# Patient Record
Sex: Female | Born: 1960
Health system: Southern US, Community
[De-identification: ages and names within clinical notes are randomized; demographics above are authoritative.]

## PROBLEM LIST (undated history)

## (undated) DIAGNOSIS — F32A Depression, unspecified: Secondary | ICD-10-CM

## (undated) HISTORY — DX: Depression, unspecified: F32.A

## (undated) HISTORY — PX: COLONOSCOPY: SHX174

---

## 2006-09-25 ENCOUNTER — Encounter: Payer: Self-pay | Admitting: Family Medicine

## 2006-09-25 LAB — CONVERTED CEMR LAB: Pap Smear: NORMAL

## 2006-09-27 ENCOUNTER — Ambulatory Visit: Payer: Self-pay | Admitting: Family Medicine

## 2006-09-28 ENCOUNTER — Other Ambulatory Visit: Admission: RE | Admit: 2006-09-28 | Discharge: 2006-09-28 | Payer: Self-pay | Admitting: Family Medicine

## 2006-09-28 ENCOUNTER — Ambulatory Visit: Payer: Self-pay | Admitting: Family Medicine

## 2006-11-09 ENCOUNTER — Ambulatory Visit: Payer: Self-pay | Admitting: Family Medicine

## 2007-05-18 ENCOUNTER — Ambulatory Visit: Payer: Self-pay | Admitting: Family Medicine

## 2007-05-18 ENCOUNTER — Encounter (INDEPENDENT_AMBULATORY_CARE_PROVIDER_SITE_OTHER): Payer: Self-pay | Admitting: Internal Medicine

## 2007-05-18 DIAGNOSIS — N760 Acute vaginitis: Secondary | ICD-10-CM | POA: Insufficient documentation

## 2007-05-18 LAB — CONVERTED CEMR LAB: KOH Prep: NEGATIVE

## 2007-05-19 LAB — CONVERTED CEMR LAB
Chlamydia, DNA Probe: NEGATIVE
GC Probe Amp, Genital: NEGATIVE

## 2007-07-26 ENCOUNTER — Telehealth: Payer: Self-pay | Admitting: Family Medicine

## 2007-08-01 ENCOUNTER — Encounter: Payer: Self-pay | Admitting: Family Medicine

## 2007-08-01 DIAGNOSIS — F329 Major depressive disorder, single episode, unspecified: Secondary | ICD-10-CM

## 2007-08-01 DIAGNOSIS — D259 Leiomyoma of uterus, unspecified: Secondary | ICD-10-CM

## 2007-08-01 DIAGNOSIS — D509 Iron deficiency anemia, unspecified: Secondary | ICD-10-CM

## 2007-08-02 ENCOUNTER — Ambulatory Visit: Payer: Self-pay | Admitting: Family Medicine

## 2007-08-02 DIAGNOSIS — R0989 Other specified symptoms and signs involving the circulatory and respiratory systems: Secondary | ICD-10-CM

## 2007-08-02 DIAGNOSIS — M533 Sacrococcygeal disorders, not elsewhere classified: Secondary | ICD-10-CM | POA: Insufficient documentation

## 2007-08-02 DIAGNOSIS — R0609 Other forms of dyspnea: Secondary | ICD-10-CM | POA: Insufficient documentation

## 2007-08-05 LAB — CONVERTED CEMR LAB
FSH: 9.1 milliintl units/mL
LH: 15.4 milliintl units/mL
TSH: 0.9 microintl units/mL (ref 0.35–5.50)

## 2007-08-11 ENCOUNTER — Ambulatory Visit: Payer: Self-pay | Admitting: Family Medicine

## 2007-08-16 LAB — CONVERTED CEMR LAB
Basophils Relative: 1 % (ref 0.0–1.0)
Eosinophils Relative: 1.4 % (ref 0.0–5.0)
HCT: 30.3 % — ABNORMAL LOW (ref 36.0–46.0)
Hemoglobin: 10 g/dL — ABNORMAL LOW (ref 12.0–15.0)
MCHC: 33 g/dL (ref 30.0–36.0)
MCV: 80.4 fL (ref 78.0–100.0)
Neutrophils Relative %: 60.1 % (ref 43.0–77.0)
Platelets: 201 10*3/uL (ref 150–400)
Transferrin: 341.8 mg/dL (ref >=212.0)
WBC: 4.6 10*3/uL (ref 4.5–10.5)

## 2007-08-23 ENCOUNTER — Ambulatory Visit: Payer: Self-pay | Admitting: Cardiology

## 2007-08-23 ENCOUNTER — Ambulatory Visit: Payer: Self-pay

## 2007-08-26 ENCOUNTER — Ambulatory Visit: Payer: Self-pay

## 2007-08-26 ENCOUNTER — Encounter: Payer: Self-pay | Admitting: Family Medicine

## 2007-09-16 ENCOUNTER — Ambulatory Visit: Payer: Self-pay | Admitting: Family Medicine

## 2007-09-19 ENCOUNTER — Encounter (INDEPENDENT_AMBULATORY_CARE_PROVIDER_SITE_OTHER): Payer: Self-pay | Admitting: *Deleted

## 2007-11-11 ENCOUNTER — Encounter (INDEPENDENT_AMBULATORY_CARE_PROVIDER_SITE_OTHER): Payer: Self-pay | Admitting: Internal Medicine

## 2007-11-11 ENCOUNTER — Ambulatory Visit: Payer: Self-pay | Admitting: Family Medicine

## 2007-11-16 ENCOUNTER — Telehealth (INDEPENDENT_AMBULATORY_CARE_PROVIDER_SITE_OTHER): Payer: Self-pay | Admitting: Internal Medicine

## 2007-11-16 LAB — CONVERTED CEMR LAB: Chlamydia, DNA Probe: NEGATIVE

## 2008-01-19 ENCOUNTER — Encounter (INDEPENDENT_AMBULATORY_CARE_PROVIDER_SITE_OTHER): Payer: Self-pay | Admitting: Internal Medicine

## 2008-01-19 ENCOUNTER — Other Ambulatory Visit: Admission: RE | Admit: 2008-01-19 | Discharge: 2008-01-19 | Payer: Self-pay | Admitting: Family Medicine

## 2008-01-19 ENCOUNTER — Ambulatory Visit: Payer: Self-pay | Admitting: Family Medicine

## 2008-01-30 LAB — CONVERTED CEMR LAB
ALT: 12 units/L (ref 0–35)
Alkaline Phosphatase: 60 units/L (ref 39–117)
BUN: 11 mg/dL (ref 6–23)
Bilirubin, Direct: 0.1 mg/dL (ref 0.0–0.3)
Calcium: 9 mg/dL (ref 8.4–10.5)
Chloride: 107 meq/L (ref 96–112)
Cholesterol: 174 mg/dL (ref 0–200)
Creatinine, Ser: 0.9 mg/dL (ref 0.4–1.2)
Eosinophils Absolute: 0.1 10*3/uL (ref 0.0–0.7)
Eosinophils Relative: 1 % (ref 0.0–5.0)
GFR calc Af Amer: 87 mL/min
GFR calc non Af Amer: 72 mL/min
HCT: 29 % — ABNORMAL LOW (ref 36.0–46.0)
Lymphocytes Relative: 24.9 % (ref 12.0–46.0)
MCHC: 31.4 g/dL (ref 30.0–36.0)
Monocytes Absolute: 0.1 10*3/uL (ref 0.1–1.0)
Monocytes Relative: 2.7 % — ABNORMAL LOW (ref 3.0–12.0)
TSH: 0.63 microintl units/mL (ref 0.35–5.50)
Total Bilirubin: 0.6 mg/dL (ref 0.3–1.2)
Total Protein: 7 g/dL (ref 6.0–8.3)
WBC: 5.1 10*3/uL (ref 4.5–10.5)

## 2008-01-31 ENCOUNTER — Encounter (INDEPENDENT_AMBULATORY_CARE_PROVIDER_SITE_OTHER): Payer: Self-pay | Admitting: *Deleted

## 2008-02-07 ENCOUNTER — Encounter (INDEPENDENT_AMBULATORY_CARE_PROVIDER_SITE_OTHER): Payer: Self-pay | Admitting: Internal Medicine

## 2008-02-07 ENCOUNTER — Encounter (INDEPENDENT_AMBULATORY_CARE_PROVIDER_SITE_OTHER): Payer: Self-pay | Admitting: *Deleted

## 2008-02-08 ENCOUNTER — Encounter (INDEPENDENT_AMBULATORY_CARE_PROVIDER_SITE_OTHER): Payer: Self-pay | Admitting: Internal Medicine

## 2008-02-09 ENCOUNTER — Ambulatory Visit: Payer: Self-pay | Admitting: Family Medicine

## 2008-02-09 ENCOUNTER — Encounter (INDEPENDENT_AMBULATORY_CARE_PROVIDER_SITE_OTHER): Payer: Self-pay | Admitting: Internal Medicine

## 2008-02-16 ENCOUNTER — Telehealth (INDEPENDENT_AMBULATORY_CARE_PROVIDER_SITE_OTHER): Payer: Self-pay | Admitting: Internal Medicine

## 2008-02-17 ENCOUNTER — Encounter (INDEPENDENT_AMBULATORY_CARE_PROVIDER_SITE_OTHER): Payer: Self-pay | Admitting: *Deleted

## 2008-03-30 ENCOUNTER — Ambulatory Visit: Payer: Self-pay | Admitting: Family Medicine

## 2008-03-30 DIAGNOSIS — J020 Streptococcal pharyngitis: Secondary | ICD-10-CM | POA: Insufficient documentation

## 2008-03-30 DIAGNOSIS — L259 Unspecified contact dermatitis, unspecified cause: Secondary | ICD-10-CM

## 2008-09-12 ENCOUNTER — Inpatient Hospital Stay (HOSPITAL_COMMUNITY): Admission: AD | Admit: 2008-09-12 | Discharge: 2008-09-12 | Payer: Self-pay | Admitting: Obstetrics and Gynecology

## 2008-10-03 ENCOUNTER — Other Ambulatory Visit: Admission: RE | Admit: 2008-10-03 | Discharge: 2008-10-03 | Payer: Self-pay | Admitting: Obstetrics & Gynecology

## 2008-10-03 ENCOUNTER — Ambulatory Visit: Payer: Self-pay | Admitting: Obstetrics & Gynecology

## 2008-10-26 DIAGNOSIS — Z9889 Other specified postprocedural states: Secondary | ICD-10-CM | POA: Insufficient documentation

## 2008-10-26 HISTORY — DX: Other specified postprocedural states: Z98.890

## 2009-10-01 ENCOUNTER — Ambulatory Visit: Payer: Self-pay | Admitting: Oncology

## 2009-10-02 ENCOUNTER — Encounter (HOSPITAL_COMMUNITY): Admission: RE | Admit: 2009-10-02 | Discharge: 2009-10-25 | Payer: Self-pay | Admitting: Endocrinology

## 2009-10-21 LAB — CBC & DIFF AND RETIC
EOS%: 0.6 % (ref 0.0–7.0)
Eosinophils Absolute: 0 10*3/uL (ref 0.0–0.5)
HCT: 28.8 % — ABNORMAL LOW (ref 34.8–46.6)
MCHC: 31.3 g/dL — ABNORMAL LOW (ref 31.5–36.0)
MONO%: 4.5 % (ref 0.0–14.0)
NEUT#: 3.2 10*3/uL (ref 1.5–6.5)
NEUT%: 64.8 % (ref 38.4–76.8)
Platelets: 225 10*3/uL (ref 145–400)
RBC: 3.95 10*6/uL (ref 3.70–5.45)
Retic %: 1.09 % (ref 0.50–1.50)
Retic Ct Abs: 43.06 10*3/uL (ref 18.30–72.70)
WBC: 4.9 10*3/uL (ref 3.9–10.3)
lymph#: 1.4 10*3/uL (ref 0.9–3.3)

## 2009-10-21 LAB — IRON AND TIBC
%SAT: 7 % — ABNORMAL LOW (ref 20–55)
TIBC: 396 ug/dL (ref 250–470)

## 2009-10-21 LAB — COMPREHENSIVE METABOLIC PANEL
Albumin: 3.6 g/dL (ref 3.5–5.2)
Alkaline Phosphatase: 63 U/L (ref 39–117)
BUN: 10 mg/dL (ref 6–23)
CO2: 28 mEq/L (ref 19–32)
Chloride: 102 mEq/L (ref 96–112)
Potassium: 3.8 mEq/L (ref 3.5–5.3)
Total Bilirubin: 0.4 mg/dL (ref 0.3–1.2)

## 2009-10-21 LAB — MORPHOLOGY: PLT EST: ADEQUATE

## 2009-10-21 LAB — FERRITIN: Ferritin: 6 ng/mL — ABNORMAL LOW (ref 10–291)

## 2009-10-21 LAB — CHCC SMEAR

## 2009-12-16 ENCOUNTER — Ambulatory Visit: Payer: Self-pay | Admitting: Oncology

## 2010-01-15 ENCOUNTER — Ambulatory Visit: Payer: Self-pay | Admitting: Oncology

## 2010-02-14 ENCOUNTER — Ambulatory Visit: Payer: Self-pay | Admitting: Oncology

## 2010-02-14 LAB — CBC WITH DIFFERENTIAL/PLATELET
Basophils Absolute: 0 10*3/uL (ref 0.0–0.1)
EOS%: 0.9 % (ref 0.0–7.0)
HGB: 10.4 g/dL — ABNORMAL LOW (ref 11.6–15.9)
LYMPH%: 29.6 % (ref 14.0–49.7)
MCH: 28.5 pg (ref 25.1–34.0)
MCV: 84.3 fL (ref 79.5–101.0)
NEUT#: 2.7 10*3/uL (ref 1.5–6.5)
NEUT%: 61.9 % (ref 38.4–76.8)
RBC: 3.65 10*6/uL — ABNORMAL LOW (ref 3.70–5.45)
WBC: 4.4 10*3/uL (ref 3.9–10.3)

## 2010-02-14 LAB — IRON AND TIBC
%SAT: 10 % — ABNORMAL LOW (ref 20–55)
Iron: 43 ug/dL (ref 42–145)
TIBC: 422 ug/dL (ref 250–470)

## 2010-04-21 IMAGING — US US TRANSVAGINAL NON-OB
1 series · 14 of 25 positions shown · non-contrast
Comparison: None

CLINICAL DATA: Left-sided pelvic pain.  Abnormal uterine bleeding.
Pelvic mass.  Enlarged uterus.

TRANSABDOMINAL AND TRANSVAGINAL ULTRASOUND OF PELVIS
TECHNIQUE: Both transabdominal and transvaginal ultrasound
examinations of the pelvis were performed including evaluation of
the uterus, ovaries, adnexal regions, and pelvic cul-de-sac.

[Series 1: us transvaginal non-ob · 0.28mm/px · 70 acquisitions, 14 frames shown]
[im 1/70]
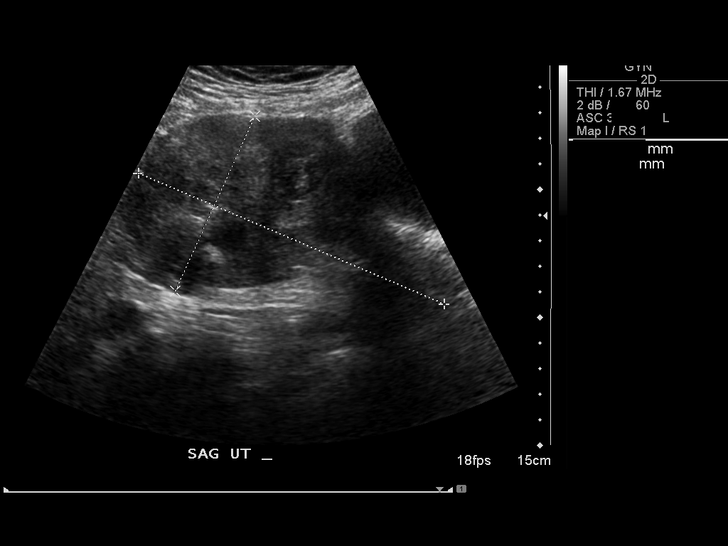
[im 6/70]
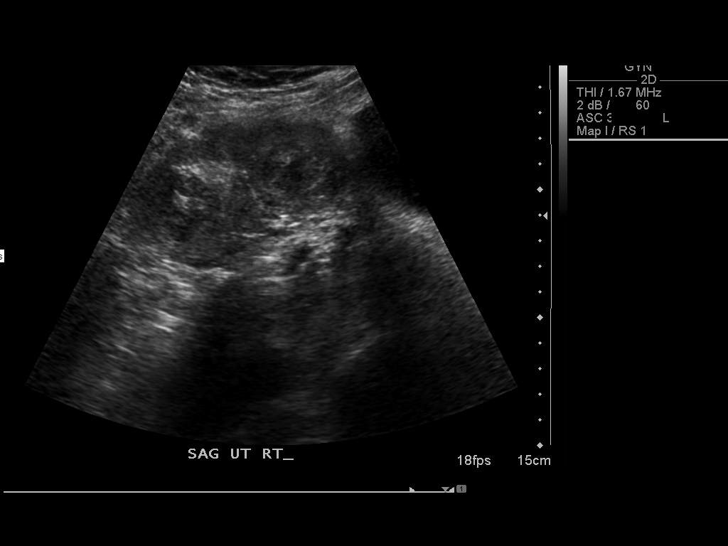
[im 12/70]
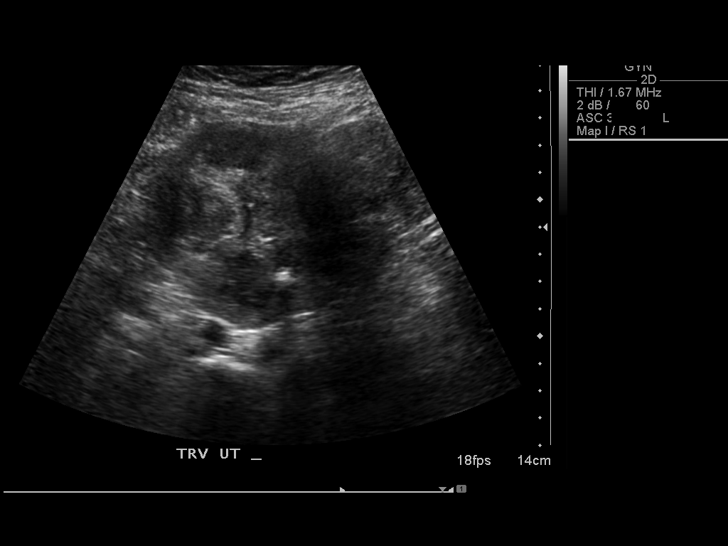
[im 18/70]
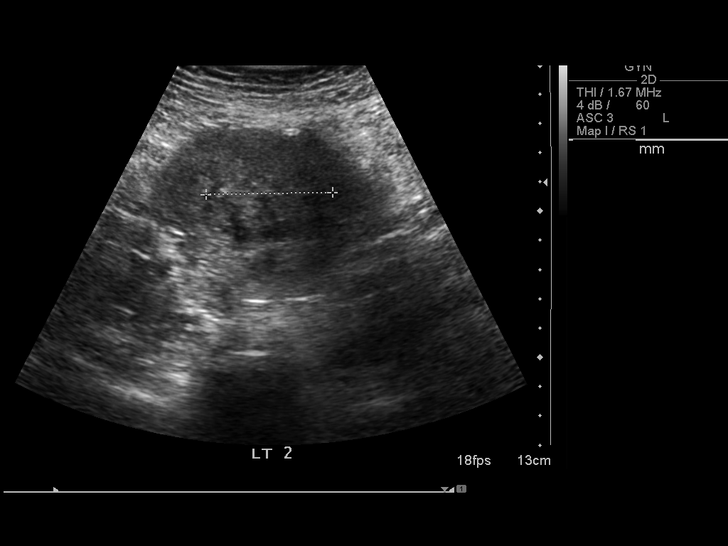
[im 24/70]
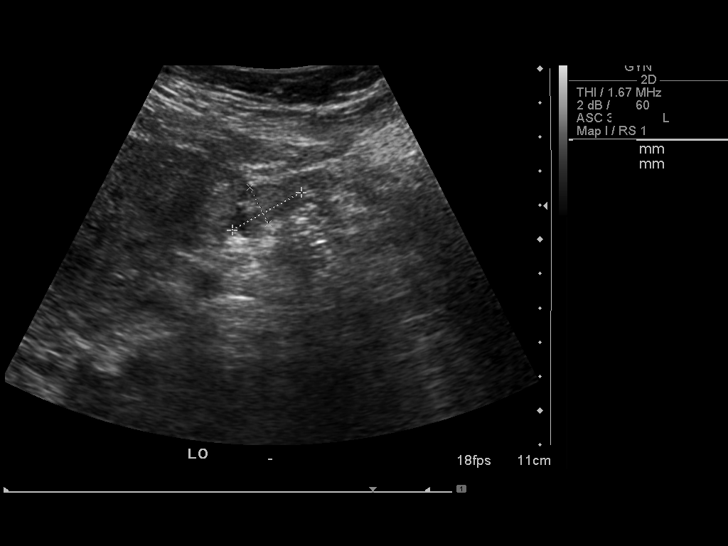
[im 26/70]
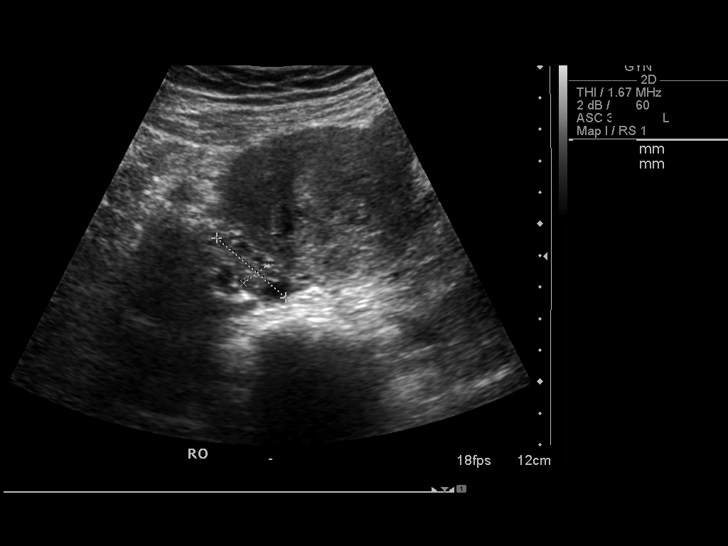
[im 32/70]
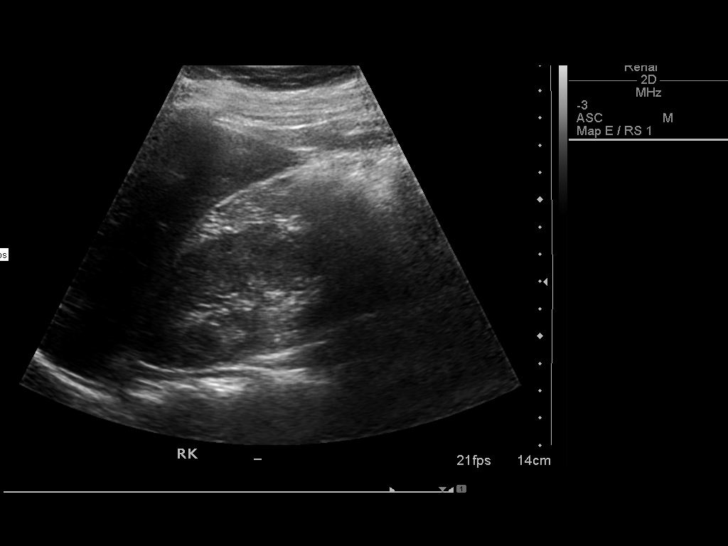
[im 38/70]
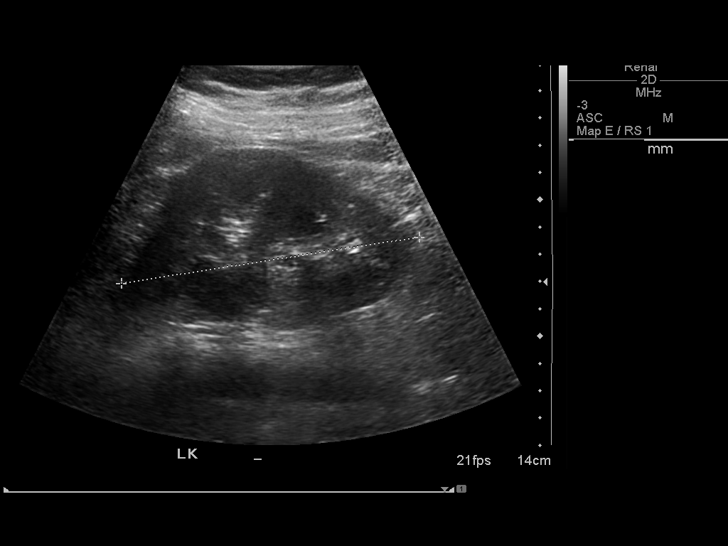
[im 44/70]
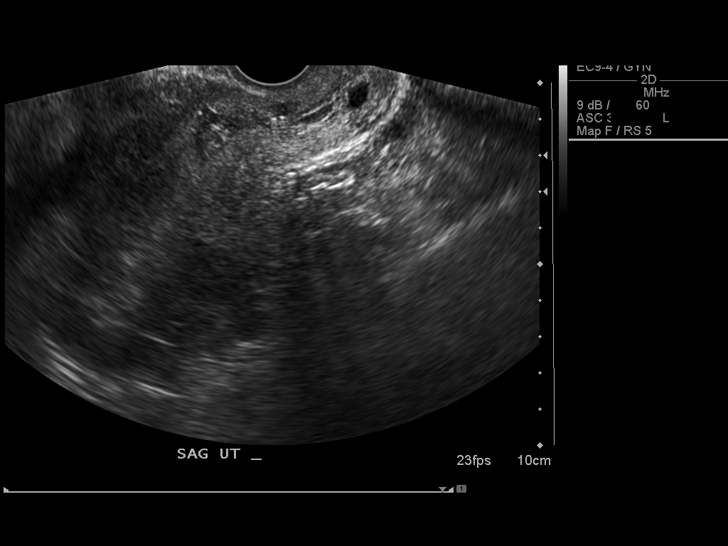
[im 47/70]
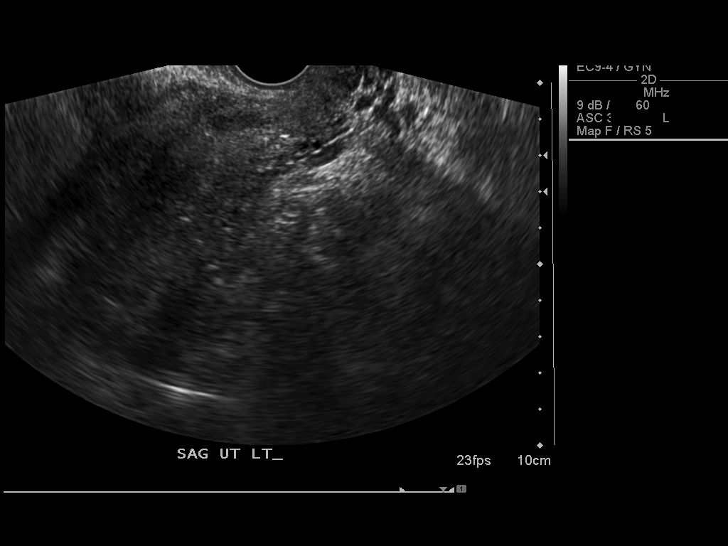
[im 52/70]
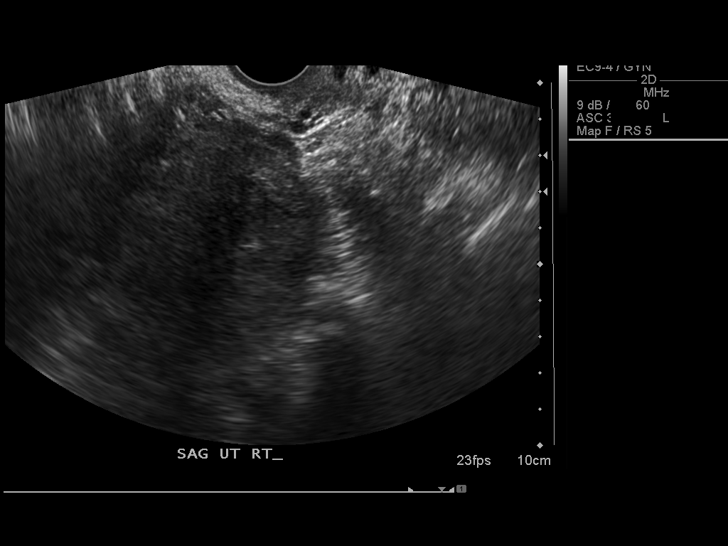
[im 58/70]
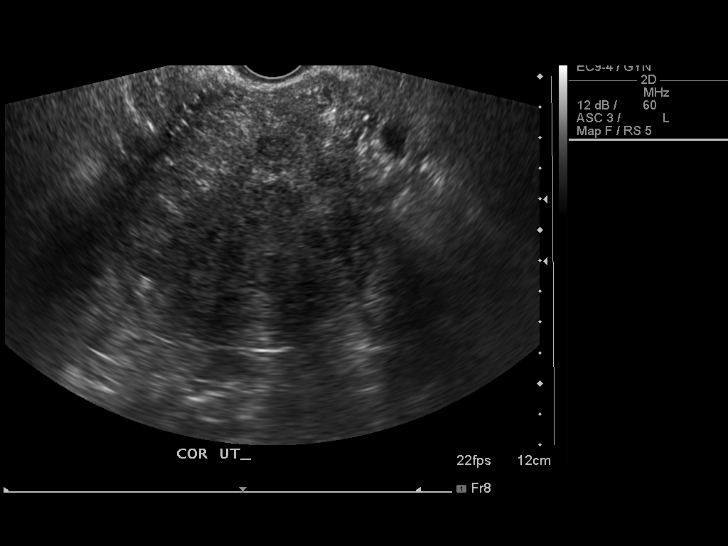
[im 64/70]
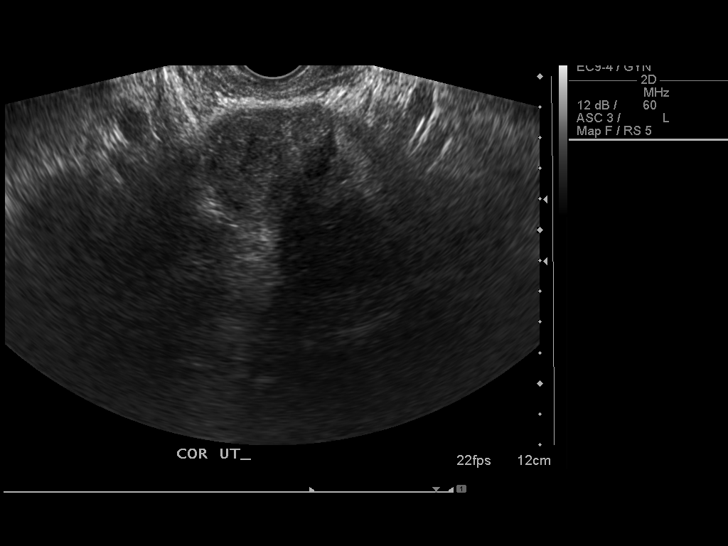
[im 70/70]
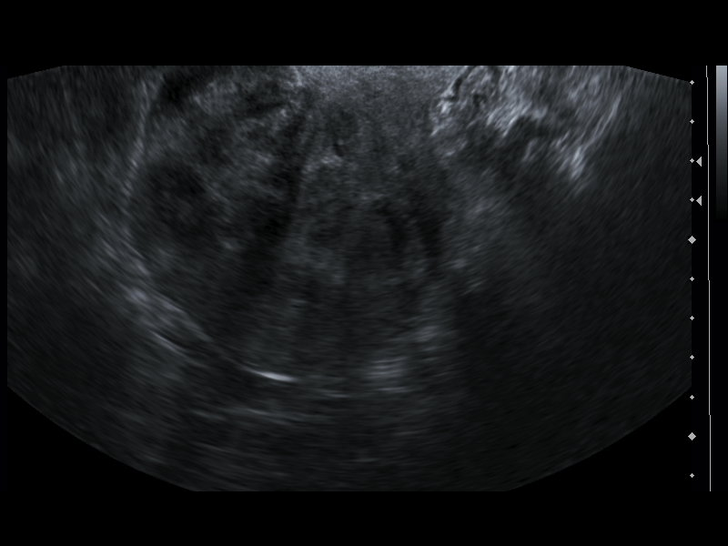

[14 of 25 positions shown; findings below may reference images not displayed]

FINDINGS: The uterus is enlarged, measuring approximately 13 by 8 x
9 cm.  Multiple fibroids are seen involving the uterus diffusely.
Several of these fibroids show calcifications.  The largest of
these fibroids measures 4.3 cm in diameter.  The endometrium is not
well visualized due to acoustic shadowing from these fibroids.

Both ovaries are normal in appearance.  No adnexal masses are
identified and there is no evidence of free fluid.  Limited images
of the kidneys show no evidence of hydronephrosis.
IMPRESSION: 1.  Moderately enlarged uterus with numerous fibroids, largest
measuring 4.3 cm.
2.  Normal ovaries.

## 2010-10-01 ENCOUNTER — Ambulatory Visit: Payer: Self-pay | Admitting: Oncology

## 2011-03-10 NOTE — Procedures (Signed)
[facility]                              EXERCISE TREADMILL   NAME:[name], Susan                         MRN:          [id number]  DATE:08/23/2007                            DOB:          [birth year]    REFERRING PHYSICIAN:  Kerby Nora, MD   [name] is referred today by Dr. Kerby Nora of [facility]  for dyspnea on exertion and concern about coronary disease.   She has no significant risk factors.   Her baseline blood pressure today is 122/74, her pulse was 81 and  regular.  Her baseline ECG is normal.   She exercised on a Bruce protocol.  She developed chest tightness and  dizziness.  Her blood pressure increased to 130/63.  She exercised for a  total of 6 minutes and 55 seconds, reaching a peak heart rate of 169  beats per minute which is 96% of predicted maximum heart rate.   Three minutes and 50 second into her stress test, she had ST segment  horizontal flattening in II, III and aVF and V5 and 6.  This started to  go flat to downward at 4 minutes and 50 seconds and at 5 minutes and 50  seconds.   The EKG changes started to resolve significantly at less than a minute.  At a minute and 50 seconds, they were totally resolved.   CONCLUSION:  1. Equivocal exercise stress study.  2. Dyspnea on exertion and chest tightness with EKG changes.  3. No significant cardiac risk factors.   I had a long talk with [name].  I have recommended an exercise rest-  stress Myoview.  We will make arrangements for this in the near future.     Thomas C. Daleen Squibb, MD, [facility]  Electronically Signed    TCW/MedQ  DD: 08/23/2007  DT: 08/24/2007  Job #: [id number]   cc:   Kerby Nora, MD

## 2011-03-10 NOTE — Group Therapy Note (Signed)
Susan [name], Susan [name] NO.:  [id number]   MEDICAL RECORD NO.:  [id number]          PATIENT TYPE:  WOC   LOCATION:  [facility]                   FACILITY:  [facility]   PHYSICIAN:  Dorthula Perfect, MD     DATE OF BIRTH:  [birth year]   DATE OF SERVICE:  10/03/2008                                  CLINIC NOTE   A 49 year old black female multigravida who comes in today referred from  [facility]U with dysfunctional uterine bleeding and fibroids.  Prior to October  of this year she had a normal menstrual history with periods lasting 3  days without any significant pain or abnormal flow.  For birth control,  she elected to use Depo-Provera, which she received for the first time  in October.  Ever since that time she has had abnormal daily vaginal  bleeding, often using more than 4 pads per day.  She sometimes stains  her underwear.  She does have significantly increased pain and cramping  with it.  When she was seen in the MAU, she had a  transabdominal/transvaginal ultrasound, which revealed an enlarged  uterus measuring 3 x 8 x 9 cm containing multiple fibroids diffusely  located.  Several contained calcifications.  The largest fibroid  measured 4.3 cm in diameter.  The endometrium was not well visualized.  The ovaries were normal.   Of interest to note, was back in about 2002 in [place], she had  uterine embolization done for fibroids.  She does not recall having had  an ultrasound since that time.   PHYSICAL EXAM:  Height 5 feet 3 inches, weight 158, blood pressure  115/67.  Examination of the abdomen reveals the uterine fundus being palpable up  to about 2 fingerbreadths below the umbilicus.  It is nodular and the  upper part of the uterus is somewhat tender.  The uterus is about the  height of a 16-week pregnancy without the lateral enlargement.  Bimanual  exam:  External genitalia, BUS glands are normal, vaginal vault, as was  the cervix, which is located somewhat  anteriorly in the vagina.  Bimanual examination confirms the enlarged uterus measuring about 20 cm  in height.  It is quite nodular.  The ovaries are not palpable on either  side because of the size of the uterus.   The cervix was grasped with a single-tooth tenaculum.  The uterus  sounded anteriorly greater than 15 cm.  Endometrial biopsy was done  using 2 devices.  Each of these sounded the uterus about 20 cm.   IMPRESSION:  1. Numerous uterine fibroids.  2. Abnormal uterine bleeding.  Whether or not the abnormal uterine      bleeding is secondary to Depo-Provera or the fibroids is kind of      difficult to ascertain.  Of note, as mentioned above, she had a      normal menstrual history prior to October, when she received the      Depo-Provera.   PLAN:  1. She will return in 2 weeks for results.  2. I have asked her to not repeat the Depo-Provera.  If she  wants to      wait 4, 6 or 8 weeks to see if her periods become normal, I think      that is reasonable.  But then again, she may elect to end up having      a hysterectomy.           ______________________________  Dorthula Perfect, MD     ER/MEDQ  D:  10/03/2008  T:  10/03/2008  Job:  [id number]

## 2011-03-13 NOTE — Assessment & Plan Note (Signed)
[facility]                           [facility] OFFICE NOTE   NAME:[name], [name]                         MRN:          [id number]  DATE:09/27/2006                            DOB:          06-07-1961    CHIEF COMPLAINT:  A 50 year old black female here to establish new  doctor.   HISTORY OF PRESENT ILLNESS:  Mrs. Susan [name] moved here from [facility]  secondary to job many months ago.  She has the following issues:  1. Iron deficiency anemia, chronic:  She denies any current fatigue,      shortness of breath, dizziness.  She is unsure as to why she has      chronic anemia, but states the previous doctor thought it was      because of her uterine fibroids.  She is status post uterine artery      embolization.  She denies any abdominal cramping or heavy vaginal      bleeding.  She does feel like she may be low in iron because she      has not been taking iron regularly.  2. Depression, well controlled:  On Cymbalta without side effects      mixed without homicidal ideations.  She feels this is secondary to      decreased stress in her life.  She feels much better after moving      and from separating from her husband one year ago.  3. STD exposure:  She is concerned that she may have a sexually      transmitted disease because her husband had a girlfriend before      they stopped being sexually active.  She has no specific disease      she is concerned about, but would like to be tested for all of      them.   PAST MEDICAL HISTORY:  1. Anemia, iron deficiency.  2. Uterine fibroids.  3. Depression.   HOSPITALIZATIONS, SURGERIES AND PROCEDURES:  1. In 2003, uterine artery embolization.  2. In 1987 and 1992, C-section.  3. Mammogram negative in 2005.  4. Pap smear abnormal in 2004, not followed up on.   FAMILY HISTORY:  Father deceased at age 15 with stroke and heart attack.  Mother alive at age 36 with hypertension and chronic renal  insufficiency.  She has three brothers, one of whom died with HIV, one  who died with liver disease and one who is alive with asbestosis.  She  also has three sisters who are healthy.  She has no family history of MI  less than age 52.  She has an aunt with diabetes.  Her mother got breast  cancer at age 55, but there is no other cancer that runs in the family.   SOCIAL HISTORY:  Denies smoking, alcohol and drug abuse.  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  She has been separated for one year.  She is not  currently sexually active.  She has two children who are healthy.  She  does not get regular exercise.  She does  not have a healthy diet and  gets limited fruits and vegetables.  She drinks some water.   PHYSICAL EXAMINATION:  VITAL SIGNS:  Height 63-1/2 inches, weight 165,  making BMI 29, blood pressure 104/60, pulse 80, temperature 98.4.  GENERAL:  Healthy appearing female in no apparent distress.  HEENT:  PERRLA.  Extraocular muscles are intact.  Tympanic membranes  clear.  Nares clear.  Oropharynx clear.  No thyromegaly.  No  lymphadenopathy.  Normal conjunctivae.  No paleness.  CARDIOVASCULAR:  Regular rate and rhythm.  No murmurs, rubs or gallops.  Normal PMI.  Peripheral pulses 2+.  LUNGS:  Clear to auscultation bilaterally.  No wheezes, rales or  rhonchi.  ABDOMEN:  Soft, nontender, normoactive bowel sounds.  No  hepatosplenomegaly.  MUSCULOSKELETAL:  Strength 5/5 in upper and lower extremities.  NEUROLOGICAL:  Cranial nerves II-XII grossly intact.  Reflexes 2+.   ASSESSMENT/PLAN:  1. Iron deficiency anemia.  Will check hemoglobin and iron panel.  She      is asymptomatic at this point in time.  2. Depression, well controlled.  Will continue on Cymbalta.  No      refills needed at this point in time.  3. STD screening.  We will check an HIV and a syphilis.  She will      return for gonorrhea and chlamydia along with a Pap smear.  4. Prevention.  She will be scheduled for a  mammogram.  She is due for      a Pap smear and will return for this.  We can also check for      trichomonas with a wet-prep at that point in time.  She refused      influenza vaccine today.  She will consider a tetanus.  She is      unsure when her cholesterol panel was last checked, but states it      was normal at that last time.  I will obtain records from her      previous doctor.     Kerby Nora, MD  Electronically Signed    AB/MedQ  DD: 09/27/2006  DT: 09/28/2006  Job #: [id number]

## 2011-05-12 IMAGING — NM NM THYROID UPTAKE SINGLE (24 HR)
1 series · 1 of 1 positions shown · non-contrast
Comparison: None

CLINICAL DATA: Hyperthyroidism.

NUCLEAR MEDICINE 24 HOUR THYROID UPTAKE
TECHNIQUE: Following  per oral administered of S-1P1, thyroid
radioiodine uptake was calculated at  24 hours.
Radiopharmaceutical: 10.9 microcuries of S-1P1

[Series 1: st static · 2.35mm/px · 1 of 1 slices shown]
[im 1/1]
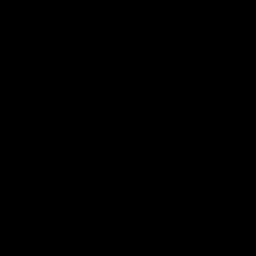

[1 of 1 positions shown; findings below may reference images not displayed]

FINDINGS: The 24 hours S-1P1 uptake was 0.2%.  Because of this no
scan could be performed.  This finding could be secondary to acute
thyroiditis.
IMPRESSION: The 24 hour S-1P1 uptake was 0.2%.  Normal is 10 35%. Findings
could be due to acute thyroiditis
Thyroid scan could not be performed because of the low uptake.

## 2011-07-15 ENCOUNTER — Inpatient Hospital Stay (INDEPENDENT_AMBULATORY_CARE_PROVIDER_SITE_OTHER)
Admission: RE | Admit: 2011-07-15 | Discharge: 2011-07-15 | Disposition: A | Discharge status: Home / Self Care | Payer: BC Managed Care – PPO | Source: Ambulatory Visit | Attending: Emergency Medicine | Admitting: Emergency Medicine

## 2011-07-15 DIAGNOSIS — D649 Anemia, unspecified: Secondary | ICD-10-CM

## 2011-07-15 DIAGNOSIS — G44209 Tension-type headache, unspecified, not intractable: Secondary | ICD-10-CM

## 2011-07-15 DIAGNOSIS — F3289 Other specified depressive episodes: Secondary | ICD-10-CM

## 2011-07-15 DIAGNOSIS — G4700 Insomnia, unspecified: Secondary | ICD-10-CM

## 2011-07-15 DIAGNOSIS — F329 Major depressive disorder, single episode, unspecified: Secondary | ICD-10-CM

## 2011-07-15 LAB — POCT I-STAT, CHEM 8
Creatinine, Ser: 0.8 mg/dL (ref 0.50–1.10)
Glucose, Bld: 88 mg/dL (ref 70–99)
HCT: 28 % — ABNORMAL LOW (ref 36.0–46.0)
Hemoglobin: 9.5 g/dL — ABNORMAL LOW (ref 12.0–15.0)
Potassium: 4.2 mEq/L (ref 3.5–5.1)
TCO2: 24 mmol/L (ref 0–100)

## 2011-07-29 LAB — CBC
MCV: 84.8
Platelets: 205
RBC: 3.91
WBC: 9.2

## 2011-07-29 LAB — WET PREP, GENITAL
Trich, Wet Prep: NONE SEEN
Yeast Wet Prep HPF POC: NONE SEEN

## 2011-07-29 LAB — URINALYSIS, ROUTINE W REFLEX MICROSCOPIC
Glucose, UA: NEGATIVE
Leukocytes, UA: NEGATIVE
Nitrite: NEGATIVE
Protein, ur: NEGATIVE
Urobilinogen, UA: 0.2
pH: 5.5

## 2011-07-29 LAB — POCT PREGNANCY, URINE: Preg Test, Ur: NEGATIVE

## 2011-07-29 LAB — URINE MICROSCOPIC-ADD ON

## 2011-07-31 LAB — POCT PREGNANCY, URINE: Preg Test, Ur: NEGATIVE

## 2020-01-02 DIAGNOSIS — R600 Localized edema: Secondary | ICD-10-CM | POA: Insufficient documentation

## 2020-01-02 DIAGNOSIS — R42 Dizziness and giddiness: Secondary | ICD-10-CM | POA: Insufficient documentation

## 2020-01-02 DIAGNOSIS — R0602 Shortness of breath: Secondary | ICD-10-CM | POA: Insufficient documentation

## 2021-03-17 DIAGNOSIS — Z01419 Encounter for gynecological examination (general) (routine) without abnormal findings: Secondary | ICD-10-CM | POA: Diagnosis not present

## 2021-03-17 DIAGNOSIS — Z1159 Encounter for screening for other viral diseases: Secondary | ICD-10-CM | POA: Diagnosis not present

## 2021-03-17 DIAGNOSIS — R3915 Urgency of urination: Secondary | ICD-10-CM | POA: Diagnosis not present

## 2021-03-17 DIAGNOSIS — Z113 Encounter for screening for infections with a predominantly sexual mode of transmission: Secondary | ICD-10-CM | POA: Diagnosis not present

## 2021-08-22 ENCOUNTER — Other Ambulatory Visit: Payer: Self-pay

## 2021-08-22 ENCOUNTER — Ambulatory Visit (INDEPENDENT_AMBULATORY_CARE_PROVIDER_SITE_OTHER): Payer: Self-pay | Admitting: Family

## 2021-08-22 ENCOUNTER — Encounter: Payer: Self-pay | Admitting: Family

## 2021-08-22 ENCOUNTER — Ambulatory Visit: Payer: Medicaid Other | Admitting: Family

## 2021-08-22 VITALS — BP 115/75 | HR 74 | Temp 98.3°F | Ht 63.5 in | Wt 207.6 lb

## 2021-08-22 DIAGNOSIS — F331 Major depressive disorder, recurrent, moderate: Secondary | ICD-10-CM | POA: Insufficient documentation

## 2021-08-22 DIAGNOSIS — Z0000 Encounter for general adult medical examination without abnormal findings: Secondary | ICD-10-CM

## 2021-08-22 DIAGNOSIS — R55 Syncope and collapse: Secondary | ICD-10-CM | POA: Insufficient documentation

## 2021-08-22 DIAGNOSIS — Z7689 Persons encountering health services in other specified circumstances: Secondary | ICD-10-CM | POA: Insufficient documentation

## 2021-08-22 MED ORDER — BUPROPION HCL ER (XL) 300 MG PO TB24: 300.0000 mg | ORAL_TABLET | Freq: Every day | ORAL | 0 refills | Status: DC

## 2021-08-22 NOTE — Progress Notes (Signed)
New Patient Office Visit  Subjective:  Patient ID: Susan [name], female    DOB: 07-23-1961  Age: 60 y.o. MRN: [id number]  CC:  Chief Complaint  Patient presents with   Establish Care   Depression   Loss of Consciousness    She complains of feelings of "blacking out' when laughing. Noticing 6 months ago. She denies Chest pain, or dizziness.    HPI Susan [name] presents to establish care. She also would like a physical and discuss one chronic and one acute problem today.   Syncope: Patient complains of near syncope. Onset was recently, with stable course since that time. Patient describes the episode as never actually lost consciousness, had precursor symptoms only, including feeling of almost losing consciousness. Patient also has associated symptoms of  laughing. The patient denies general feeling of lightheadedness. Taking culprit meds?: none   Depression, Follow-up:  She  was last seen for this 6 months ago by her previous PCP. Changes made at last visit include decreased med dose. She reports good compliance with treatment. She is not having side effects.  She reports good tolerance of treatment. Current symptoms include: depressed mood, fatigue, and feelings of worthlessness/guilt She feels she is Worse since last visit.  Depression screen PHQ 2/9 08/22/2021  Decreased Interest 3  Down, Depressed, Hopeless 3  PHQ - 2 Score 6  Altered sleeping 3  Tired, decreased energy 2  Change in appetite 3  Feeling bad or failure about yourself  3  Trouble concentrating 3  Moving slowly or fidgety/restless 2  Suicidal thoughts 0  PHQ-9 Score 22  Difficult doing work/chores Somewhat difficult    -----------------------------------------------------------------------------------------   Past Medical History:  Diagnosis Date   Depression     History reviewed. No pertinent surgical history.  Family History  Problem Relation Age of Onset   Hyperlipidemia Mother     Heart disease Mother    Stroke Father    Hypertension Father    Hypertension Sister    Cancer Brother    Alcohol abuse Brother     Social History   Socioeconomic History   Marital status: Divorced    Spouse name: Not on file   Number of children: Not on file   Years of education: Not on file   Highest education level: Not on file  Occupational History   Not on file  Tobacco Use   Smoking status: Never   Smokeless tobacco: Never  Substance and Sexual Activity   Alcohol use: Never   Drug use: Never   Sexual activity: Not on file  Other Topics Concern   Not on file  Social History Narrative   Not on file   Social Determinants of Health   Financial Resource Strain: Not on file  Food Insecurity: Not on file  Transportation Needs: Not on file  Physical Activity: Not on file  Stress: Not on file  Social Connections: Not on file  Intimate Partner Violence: Not on file   Review of Systems  Constitutional: Negative.   HENT: Negative.    Eyes: Negative.   Respiratory: Negative.    Cardiovascular: Negative.   Gastrointestinal: Negative.   Genitourinary: Negative.   Musculoskeletal: Negative.   Skin: Negative.   Neurological: Negative.   Endo/Heme/Allergies: Negative.   Psychiatric/Behavioral:  Positive for depression.    Objective:   Today's Vitals: BP 115/75   Pulse 74   Temp 98.3 F (36.8 C) (Temporal)   Ht 5' 3.5" (1.613 m)   Wt  207 lb 9.6 oz (94.2 kg)   SpO2 99%   BMI 36.20 kg/m   Physical Exam Vitals and nursing note reviewed.  Constitutional:      Appearance: Normal appearance.  HENT:     Head: Normocephalic.     Right Ear: Tympanic membrane normal.     Left Ear: Tympanic membrane normal.     Nose: Nose normal.     Mouth/Throat:     Mouth: Mucous membranes are moist.     Tongue: No lesions. Tongue does not deviate from midline.     Comments: Tongue has several dark, flat patches, no raised areas Eyes:     Pupils: Pupils are equal, round,  and reactive to light.  Cardiovascular:     Rate and Rhythm: Normal rate and regular rhythm.  Pulmonary:     Effort: Pulmonary effort is normal.     Breath sounds: Normal breath sounds.  Musculoskeletal:        General: Normal range of motion.     Cervical back: Normal range of motion.  Lymphadenopathy:     Cervical: No cervical adenopathy.  Skin:    General: Skin is warm and dry.  Neurological:     Mental Status: She is alert.  Psychiatric:        Mood and Affect: Mood normal.        Behavior: Behavior normal.    Assessment & Plan:   Problem List Items Addressed This Visit       Cardiovascular and Mediastinum   Near syncope    Happened one time after laughing very hard. Discussed possible etiology, pt reassured.        Other   Encounter to establish care with new doctor - Primary   Annual physical exam    Pt unable to do labs today, forgot to fast, she will return next week. Physical completed today.      Relevant Orders   Comprehensive metabolic panel   TSH   CBC with Differential/Platelet   Lipid panel   Major depressive disorder, recurrent episode, moderate (HCC)    Currently on Bupropion. Pt PHQ scale high, she reports her dose was reduced last visit with her previous PCP and she has since lost her job and has been unable to find a new one which has worsened her sx. Discussed adjunct med vs current med dose increase, she prefers to increase current dose first.      Relevant Medications   buPROPion (WELLBUTRIN XL) 300 MG 24 hr tablet    Outpatient Encounter Medications as of 08/22/2021  Medication Sig   buPROPion (WELLBUTRIN XL) 300 MG 24 hr tablet Take 1 tablet (300 mg total) by mouth daily.   Vitamin D, Ergocalciferol, (DRISDOL) 1.25 MG (50000 UNIT) CAPS capsule Take 50,000 Units by mouth once a week.   No facility-administered encounter medications on file as of 08/22/2021.    Follow-up: Return in about 1 week (around 08/29/2021) for FASTING LAB ONLY -  next week any day;  also schedule 1 mos f/u appt.   Jeanie Sewer, NP

## 2021-08-22 NOTE — Assessment & Plan Note (Signed)
Currently on Bupropion. Pt PHQ scale high, she reports her dose was reduced last visit with her previous PCP and she has since lost her job and has been unable to find a new one which has worsened her sx. Discussed adjunct med vs current med dose increase, she prefers to increase current dose first.

## 2021-08-22 NOTE — Patient Instructions (Addendum)
Welcome to [facility]! It was a pleasure meeting you today.  As discussed, Please schedule a fasting lab only visit for next week and a 1 month follow up visit for your depression, and we can see you sooner for any concerns if needed.  I have sent a refill of your Bupropion to your pharmacy.  PLEASE NOTE:  If you had any LAB tests please let us know if you have not heard back within a few days. You may see your results on MyChart before we have a chance to review them but we will give you a call once they are reviewed by [place]. If we ordered any REFERRALS today, please let us know if you have not heard from their office within the next week.  Let us know through MyChart if you are needing refills, or have your pharmacy send [place] the request. You can also use MyChart to communicate with me or any office staff.  Please try these tips to maintain a healthy lifestyle:  Eat most of your calories during the day when you are active. Eliminate processed foods including packaged sweets (pies, cakes, cookies), reduce intake of potatoes, white bread, white pasta, and white rice. Look for whole grain options, oat flour or almond flour.  Each meal should contain half fruits/vegetables, one quarter protein, and one quarter carbs (no bigger than a computer mouse).  Cut down on sweet beverages. This includes juice, soda, and sweet tea. Also watch fruit intake, though this is a healthier sweet option, it still contains natural sugar! Limit to 3 servings daily.  Drink at least 1 glass of water with each meal and aim for at least 8 glasses per day  Exercise at least 150 minutes every week.

## 2021-08-22 NOTE — Assessment & Plan Note (Signed)
Happened one time after laughing very hard. Discussed possible etiology, pt reassured.

## 2021-08-22 NOTE — Assessment & Plan Note (Signed)
Pt unable to do labs today, forgot to fast, she will return next week. Physical completed today.

## 2021-09-04 ENCOUNTER — Other Ambulatory Visit: Payer: Self-pay | Admitting: Family

## 2021-09-04 DIAGNOSIS — F331 Major depressive disorder, recurrent, moderate: Secondary | ICD-10-CM

## 2021-09-05 ENCOUNTER — Other Ambulatory Visit: Payer: Medicaid Other

## 2021-09-09 ENCOUNTER — Other Ambulatory Visit (INDEPENDENT_AMBULATORY_CARE_PROVIDER_SITE_OTHER): Payer: Self-pay

## 2021-09-09 ENCOUNTER — Other Ambulatory Visit: Payer: Self-pay

## 2021-09-09 DIAGNOSIS — Z0000 Encounter for general adult medical examination without abnormal findings: Secondary | ICD-10-CM

## 2021-09-09 LAB — COMPREHENSIVE METABOLIC PANEL
ALT: 12 U/L (ref 0–35)
AST: 18 U/L (ref 0–37)
Albumin: 4.4 g/dL (ref 3.5–5.2)
Alkaline Phosphatase: 67 U/L (ref 39–117)
BUN: 14 mg/dL (ref 6–23)
CO2: 31 mEq/L (ref 19–32)
Calcium: 9.5 mg/dL (ref 8.4–10.5)
Chloride: 106 mEq/L (ref 96–112)
Creatinine, Ser: 0.88 mg/dL (ref 0.40–1.20)
GFR: 72.19 mL/min (ref >60.00)
Glucose, Bld: 96 mg/dL (ref 70–99)
Potassium: 5 mEq/L (ref 3.5–5.1)
Sodium: 144 mEq/L (ref 135–145)
Total Bilirubin: 0.5 mg/dL (ref 0.2–1.2)
Total Protein: 7.2 g/dL (ref 6.0–8.3)

## 2021-09-09 LAB — CBC WITH DIFFERENTIAL/PLATELET
Basophils Absolute: 0 10*3/uL (ref 0.0–0.1)
Basophils Relative: 1 % (ref 0.0–3.0)
Eosinophils Absolute: 0.1 10*3/uL (ref 0.0–0.7)
Eosinophils Relative: 2 % (ref 0.0–5.0)
HCT: 34.8 % — ABNORMAL LOW (ref 36.0–46.0)
Hemoglobin: 11.2 g/dL — ABNORMAL LOW (ref 12.0–15.0)
Lymphocytes Relative: 41.3 % (ref 12.0–46.0)
Lymphs Abs: 1.5 10*3/uL (ref 0.7–4.0)
MCHC: 32.2 g/dL (ref 30.0–36.0)
MCV: 89.4 fl (ref 78.0–100.0)
Monocytes Absolute: 0.2 10*3/uL (ref 0.1–1.0)
Monocytes Relative: 5.7 % (ref 3.0–12.0)
Neutro Abs: 1.8 10*3/uL (ref 1.4–7.7)
Neutrophils Relative %: 50 % (ref 43.0–77.0)
Platelets: 192 10*3/uL (ref 150.0–400.0)
RBC: 3.89 Mil/uL (ref 3.87–5.11)
RDW: 15.4 % (ref 11.5–15.5)
WBC: 3.6 10*3/uL — ABNORMAL LOW (ref 4.0–10.5)

## 2021-09-09 LAB — LIPID PANEL
Cholesterol: 197 mg/dL (ref 0–200)
HDL: 71.4 mg/dL (ref >39.00)
LDL Cholesterol: 110 mg/dL — ABNORMAL HIGH (ref 0–99)
NonHDL: 125.39
Total CHOL/HDL Ratio: 3
Triglycerides: 77 mg/dL (ref 0.0–149.0)
VLDL: 15.4 mg/dL (ref 0.0–40.0)

## 2021-09-09 LAB — TSH: TSH: 1.67 u[IU]/mL (ref 0.35–5.50)

## 2021-09-10 ENCOUNTER — Other Ambulatory Visit: Payer: Self-pay | Admitting: Family

## 2021-09-10 ENCOUNTER — Encounter: Payer: Self-pay | Admitting: Family

## 2021-09-10 DIAGNOSIS — F331 Major depressive disorder, recurrent, moderate: Secondary | ICD-10-CM

## 2021-09-10 MED ORDER — BUPROPION HCL ER (XL) 300 MG PO TB24: 300.0000 mg | ORAL_TABLET | Freq: Every day | ORAL | 0 refills | Status: DC

## 2021-09-17 ENCOUNTER — Other Ambulatory Visit: Payer: Self-pay | Admitting: Family

## 2021-09-17 DIAGNOSIS — F331 Major depressive disorder, recurrent, moderate: Secondary | ICD-10-CM

## 2021-09-17 MED ORDER — VENLAFAXINE HCL ER 37.5 MG PO CP24: 37.5000 mg | ORAL_CAPSULE | Freq: Every day | ORAL | 0 refills | Status: DC

## 2021-10-16 ENCOUNTER — Ambulatory Visit: Payer: Self-pay | Admitting: Family

## 2021-10-26 ENCOUNTER — Ambulatory Visit (HOSPITAL_COMMUNITY): Payer: Self-pay

## 2021-10-29 ENCOUNTER — Ambulatory Visit
Admission: EM | Admit: 2021-10-29 | Discharge: 2021-10-29 | Disposition: A | Discharge status: Home / Self Care | Payer: Self-pay | Attending: Internal Medicine | Admitting: Internal Medicine

## 2021-10-29 ENCOUNTER — Ambulatory Visit (INDEPENDENT_AMBULATORY_CARE_PROVIDER_SITE_OTHER): Payer: Self-pay

## 2021-10-29 ENCOUNTER — Other Ambulatory Visit: Payer: Self-pay

## 2021-10-29 DIAGNOSIS — R059 Cough, unspecified: Secondary | ICD-10-CM

## 2021-10-29 DIAGNOSIS — J069 Acute upper respiratory infection, unspecified: Secondary | ICD-10-CM

## 2021-10-29 DIAGNOSIS — R053 Chronic cough: Secondary | ICD-10-CM

## 2021-10-29 MED ORDER — BENZONATATE 100 MG PO CAPS: 100.0000 mg | ORAL_CAPSULE | Freq: Three times a day (TID) | ORAL | 0 refills | Status: DC | PRN

## 2021-10-29 MED ORDER — AMOXICILLIN-POT CLAVULANATE 875-125 MG PO TABS: 1.0000 | ORAL_TABLET | Freq: Two times a day (BID) | ORAL | 0 refills | Status: DC

## 2021-10-29 MED ORDER — PREDNISONE 20 MG PO TABS: 40.0000 mg | ORAL_TABLET | Freq: Every day | ORAL | 0 refills | Status: AC

## 2021-10-29 NOTE — ED Triage Notes (Signed)
Congestion and cough started around the first week of December 2022 and has continued to be ongoing. Four day h/o hoarseness. No meds taken. No v/d.

## 2021-10-29 NOTE — Discharge Instructions (Addendum)
You have been present for 3 medications ro help alleviate your symptoms.  Please follow-up if symptoms persist or worsen.  Your chest x-ray was normal.

## 2021-10-29 NOTE — ED Provider Notes (Signed)
[facility]    CSN: [id number] Arrival date & time: 10/29/21  1426      History   Chief Complaint Chief Complaint  Patient presents with   Nasal Congestion   Hoarse    HPI Susan [name] is a 61 y.o. female.   Patient presents with approximately 1 month history of nasal congestion and cough.  Cough is productive with yellow to green sputum per patient.  Patient did have a fever at beginning of symptoms but reports that she has not had fever since.  Denies any known sick contacts.  Denies chest pain, shortness of breath, sore throat, ear pain, nausea, vomiting, diarrhea, abdominal pain.  Patient has taken several over-the-counter cough and cold medications when symptoms first started with minimal improvement.    Past Medical History:  Diagnosis Date   Depression     Patient Active Problem List   Diagnosis Date Noted   Encounter to establish care with new doctor 08/22/2021   Annual physical exam 08/22/2021   Major depressive disorder, recurrent episode, moderate ([place]) 08/22/2021   Near syncope 08/22/2021   STREP THROAT 03/30/2008   ECZEMA 03/30/2008   COCCYGEAL PAIN 08/02/2007   DYSPNEA ON EXERTION 08/02/2007   FIBROIDS, UTERUS 08/01/2007   ANEMIA-IRON DEFICIENCY 08/01/2007   DEPRESSION 08/01/2007   BACTERIAL VAGINITIS 05/18/2007    History reviewed. No pertinent surgical history.  OB History   No obstetric history on file.      Home Medications    Prior to Admission medications   Medication Sig Start Date End Date Taking? Authorizing Provider  amoxicillin-clavulanate (AUGMENTIN) 875-125 MG tablet Take 1 tablet by mouth every 12 (twelve) hours. 10/29/21  Yes Krikor Willet, Hildred Alamin E, FNP  benzonatate (TESSALON) 100 MG capsule Take 1 capsule (100 mg total) by mouth every 8 (eight) hours as needed for cough. 10/29/21  Yes Kristoffer Bala, Hildred Alamin E, FNP  predniSONE (DELTASONE) 20 MG tablet Take 2 tablets (40 mg total) by mouth daily for 5 days. 10/29/21 11/03/21 Yes Rahshawn Remo,  Michele Rockers, FNP  buPROPion (WELLBUTRIN XL) 300 MG 24 hr tablet Take 1 tablet (300 mg total) by mouth daily. 09/10/21 12/09/21  Jeanie Sewer, NP  venlafaxine XR (EFFEXOR XR) 37.5 MG 24 hr capsule Take 1 capsule (37.5 mg total) by mouth daily with breakfast. Do not take with your Bupropion. Can take Bupropion at lunch or supper. 09/17/21   Jeanie Sewer, NP  Vitamin D, Ergocalciferol, (DRISDOL) 1.25 MG (50000 UNIT) CAPS capsule Take 50,000 Units by mouth once a week. 06/06/21   [provider]    Family History Family History  Problem Relation Age of Onset   Hyperlipidemia Mother    Heart disease Mother    Stroke Father    Hypertension Father    Hypertension Sister    Cancer Brother    Alcohol abuse Brother     Social History Social History   Tobacco Use   Smoking status: Never   Smokeless tobacco: Never  Substance Use Topics   Alcohol use: Never   Drug use: Never     Allergies   Patient has no known allergies.   Review of Systems Review of Systems Per HPI  Physical Exam Triage Vital Signs ED Triage Vitals  Enc Vitals Group     BP 10/29/21 1554 (!) 148/99     Pulse Rate 10/29/21 1554 75     Resp 10/29/21 1554 18     Temp 10/29/21 1554 98.3 F (36.8 C)     Temp  Source 10/29/21 1554 Oral     SpO2 10/29/21 1554 95 %     Weight --      Height --      Head Circumference --      Peak Flow --      Pain Score 10/29/21 1556 0     Pain Loc --      Pain Edu? --      Excl. in [place]? --    No data found.  Updated Vital Signs BP (!) 148/99 (BP Location: Left Arm)    Pulse 75    Temp 98.3 F (36.8 C) (Oral)    Resp 18    SpO2 95%   Visual Acuity Right Eye Distance:   Left Eye Distance:   Bilateral Distance:    Right Eye Near:   Left Eye Near:    Bilateral Near:     Physical Exam Constitutional:      General: She is not in acute distress.    Appearance: Normal appearance. She is not toxic-appearing or diaphoretic.  HENT:     Head: Normocephalic  and atraumatic.     Right Ear: Tympanic membrane and ear canal normal.     Left Ear: Tympanic membrane and ear canal normal.     Nose: Congestion present.     Mouth/Throat:     Mouth: Mucous membranes are moist.     Pharynx: No posterior oropharyngeal erythema.  Eyes:     Extraocular Movements: Extraocular movements intact.     Conjunctiva/sclera: Conjunctivae normal.     Pupils: Pupils are equal, round, and reactive to light.  Cardiovascular:     Rate and Rhythm: Normal rate and regular rhythm.     Pulses: Normal pulses.     Heart sounds: Normal heart sounds.  Pulmonary:     Effort: Pulmonary effort is normal. No respiratory distress.     Breath sounds: Normal breath sounds. No stridor. No wheezing, rhonchi or rales.  Abdominal:     General: Abdomen is flat. Bowel sounds are normal.     Palpations: Abdomen is soft.  Musculoskeletal:        General: Normal range of motion.     Cervical back: Normal range of motion.  Skin:    General: Skin is warm and dry.  Neurological:     General: No focal deficit present.     Mental Status: She is alert and oriented to person, place, and time. Mental status is at baseline.  Psychiatric:        Mood and Affect: Mood normal.        Behavior: Behavior normal.     UC Treatments / Results  Labs (all labs ordered are listed, but only abnormal results are displayed) Labs Reviewed - No data to display  EKG   Radiology DG Chest 2 View  Result Date: 10/29/2021 CLINICAL DATA:  cough EXAM: CHEST - 2 VIEW COMPARISON:  None. FINDINGS: No consolidation. No visible pleural effusions or pneumothorax. Cardiomediastinal silhouette is within normal limits. No evidence of acute osseous abnormality. IMPRESSION: No evidence of acute cardiopulmonary disease. Electronically Signed   By: Margaretha Sheffield M.D.   On: 10/29/2021 16:19    Procedures Procedures (including critical care time)  Medications Ordered in UC Medications - No data to  display  Initial Impression / Assessment and Plan / UC Course  I have reviewed the triage vital signs and the nursing notes.  Pertinent labs & imaging results that were available during my care of  the patient were reviewed by me and considered in my medical decision making (see chart for details).     Chest x-ray was negative for any acute cardiopulmonary process.  Will treat acute upper respiratory infection with Augmentin antibiotic.  Do think patient would benefit from prednisone as well.  Will prescribe low-dose and short course of prednisone given patient's history of bilateral lower extremity swelling.  Patient does not currently have any extremity swelling and reports that she has taken prednisone previously with no side effects.  Benzonatate prescribed take as needed for cough as well.  Discussed return precautions.  Patient verbalized understanding and was agreeable with plan. Final Clinical Impressions(s) / UC Diagnoses   Final diagnoses:  Acute upper respiratory infection  Persistent cough for 3 weeks or longer     Discharge Instructions      You have been present for 3 medications ro help alleviate your symptoms.  Please follow-up if symptoms persist or worsen.  Your chest x-ray was normal.     ED Prescriptions     Medication Sig Dispense Auth. Provider   amoxicillin-clavulanate (AUGMENTIN) 875-125 MG tablet Take 1 tablet by mouth every 12 (twelve) hours. 14 tablet [place], [place] E, [place]   predniSONE (DELTASONE) 20 MG tablet Take 2 tablets (40 mg total) by mouth daily for 5 days. 10 tablet [place], [place] E, [place]   benzonatate (TESSALON) 100 MG capsule Take 1 capsule (100 mg total) by mouth every 8 (eight) hours as needed for cough. 21 capsule [place], Michele Rockers, [place]      PDMP not reviewed this encounter.   Teodora Medici, [place] 10/29/21 [phone]

## 2021-10-31 ENCOUNTER — Ambulatory Visit: Payer: Self-pay | Admitting: Family

## 2021-11-05 ENCOUNTER — Ambulatory Visit: Payer: Medicaid Other | Admitting: Family

## 2021-12-01 ENCOUNTER — Ambulatory Visit: Payer: Self-pay | Admitting: Family

## 2021-12-05 ENCOUNTER — Encounter: Payer: Self-pay | Admitting: Family

## 2021-12-05 ENCOUNTER — Other Ambulatory Visit: Payer: Self-pay

## 2021-12-05 ENCOUNTER — Ambulatory Visit (INDEPENDENT_AMBULATORY_CARE_PROVIDER_SITE_OTHER): Payer: Self-pay | Admitting: Family

## 2021-12-05 VITALS — BP 118/76 | HR 83 | Temp 97.6°F | Ht 63.0 in | Wt 207.6 lb

## 2021-12-05 DIAGNOSIS — E559 Vitamin D deficiency, unspecified: Secondary | ICD-10-CM

## 2021-12-05 DIAGNOSIS — F331 Major depressive disorder, recurrent, moderate: Secondary | ICD-10-CM

## 2021-12-05 DIAGNOSIS — Z7689 Persons encountering health services in other specified circumstances: Secondary | ICD-10-CM

## 2021-12-05 MED ORDER — BUPROPION HCL ER (XL) 300 MG PO TB24: 300.0000 mg | ORAL_TABLET | Freq: Every day | ORAL | 1 refills | Status: DC

## 2021-12-05 NOTE — Progress Notes (Signed)
Provider: Marlowe Sax FNP-C   Susan [name], Nelda Bucks, NP  Patient Care Team: Ivonne [place], Nelda Bucks, NP as PCP - General (Family Medicine)  Extended Emergency Contact Information Primary Emergency Contact: Charbonnet,Moriah Address: [address]          [address], [place] [address] [place] Phone: [phone] Relation: Daughter  Code Status:  Full Code  Goals of care: Advanced Directive information Advanced Directives 12/05/2021  Does Patient Have a Medical Advance Directive? No  Would patient like information on creating a medical advance directive? No - Patient declined     Chief Complaint  Patient presents with   Establish Care    New Patient.     HPI:  Pt is a 61 y.o. female seen today establish care here at [facility] for medical management of chronic diseases.Has medical history depression,near syncope with laughing and vitamin D deficiency among others. Feeling of passing when laughing too hard .    Past Medical History:  Diagnosis Date   Depression    No past surgical history on file.  No Known Allergies  Allergies as of 12/05/2021   No Known Allergies      Medication List        Accurate as of December 05, 2021 11:39 AM. If you have any questions, ask your nurse or doctor.          STOP taking these medications    amoxicillin-clavulanate 875-125 MG tablet Commonly known as: AUGMENTIN Stopped by: Nelda Bucks Crescentia Boutwell, NP   benzonatate 100 MG capsule Commonly known as: TESSALON Stopped by: Sandrea Hughs, NP   buPROPion 300 MG 24 hr tablet Commonly known as: WELLBUTRIN XL Stopped by: Nelda Bucks Lincoln Ginley, NP   venlafaxine XR 37.5 MG 24 hr capsule Commonly known as: Effexor XR Stopped by: Nelda Bucks Cyndal Kasson, NP       TAKE these medications    multivitamin tablet Take 1 tablet by mouth as needed.   Vitamin D (Ergocalciferol) 1.25 MG (50000 UNIT) Caps capsule Commonly known as: DRISDOL Take 50,000  Units by mouth once a week.        Review of Systems  Constitutional:  Negative for appetite change, chills, fatigue, fever and unexpected weight change.  HENT:  Negative for congestion, dental problem, ear discharge, ear pain, facial swelling, hearing loss, nosebleeds, postnasal drip, rhinorrhea, sinus pressure, sinus pain, sneezing, sore throat, tinnitus and trouble swallowing.   Eyes:  Positive for visual disturbance. Negative for pain, discharge, redness and itching.       Wears eye glasses  Respiratory:  Negative for cough, chest tightness, shortness of breath and wheezing.   Cardiovascular:  Negative for chest pain, palpitations and leg swelling.  Gastrointestinal:  Negative for abdominal distention, abdominal pain, blood in stool, constipation, diarrhea, nausea and vomiting.  Endocrine: Negative for cold intolerance, heat intolerance, polydipsia, polyphagia and polyuria.  Genitourinary:  Negative for difficulty urinating, dysuria, flank pain, frequency and urgency.  Musculoskeletal:  Negative for arthralgias, back pain, gait problem, joint swelling, myalgias, neck pain and neck stiffness.  Skin:  Negative for color change, pallor, rash and wound.  Neurological:  Negative for dizziness, syncope, speech difficulty, weakness, light-headedness, numbness and headaches.  Hematological:  Does not bruise/bleed easily.  Psychiatric/Behavioral:  Negative for agitation, behavioral problems, confusion, hallucinations, self-injury, sleep disturbance and suicidal ideas. The patient is not nervous/anxious.        Depression  Sleeps 4-5 hrs    Immunization History  Administered Date(s) Administered   Moderna Sars-Covid-2 Vaccination 01/08/2020, 02/05/2020   Td 10/26/1990, 11/11/2007   Pertinent  Health Maintenance Due  Topic Date Due   COLONOSCOPY (Pts 45-32yrs Insurance coverage will need to be confirmed)  Never done   PAP SMEAR-Modifier  09/25/2009   MAMMOGRAM  Never done   INFLUENZA  VACCINE  01/23/2022 (Originally 05/26/2021)   Fall Risk 08/22/2021 10/29/2021 12/05/2021  Falls in the past year? 0 - 0  Was there an injury with Fall? - - 0  Fall Risk Category Calculator - - 0  Fall Risk Category - - Low  Patient Fall Risk Level - Low fall risk Low fall risk  Patient at Risk for Falls Due to No Fall Risks - No Fall Risks  Fall risk Follow up - - Falls evaluation completed   Functional Status Survey:    Vitals:   12/05/21 1130  BP: 118/76  Pulse: 83  Temp: 97.6 F (36.4 C)  SpO2: 96%  Weight: 207 lb 9.6 oz (94.2 kg)  Height: 5\' 3"  (1.6 m)   Body mass index is 36.77 kg/m. Physical Exam Vitals reviewed.  Constitutional:      General: She is not in acute distress.    Appearance: Normal appearance. She is normal weight. She is not ill-appearing or diaphoretic.  HENT:     Head: Normocephalic.     Right Ear: Tympanic membrane, ear canal and external ear normal. There is no impacted cerumen.     Left Ear: Tympanic membrane, ear canal and external ear normal. There is no impacted cerumen.     Nose: Nose normal. No congestion or rhinorrhea.     Mouth/Throat:     Mouth: Mucous membranes are moist.     Pharynx: Oropharynx is clear. No oropharyngeal exudate or posterior oropharyngeal erythema.     Comments: Tongue has large dark color patch area non raised and unable to scrape with tongue blade. Eyes:     General: No scleral icterus.       Right eye: No discharge.        Left eye: No discharge.     Extraocular Movements: Extraocular movements intact.     Conjunctiva/sclera: Conjunctivae normal.     Pupils: Pupils are equal, round, and reactive to light.  Neck:     Vascular: No carotid bruit.  Cardiovascular:     Rate and Rhythm: Normal rate and regular rhythm.     Pulses: Normal pulses.     Heart sounds: Normal heart sounds. No murmur heard.   No friction rub. No gallop.  Pulmonary:     Effort: Pulmonary effort is normal. No respiratory distress.     Breath  sounds: Normal breath sounds. No wheezing, rhonchi or rales.  Chest:     Chest wall: No tenderness.  Abdominal:     General: Bowel sounds are normal. There is no distension.     Palpations: Abdomen is soft. There is no mass.     Tenderness: There is no abdominal tenderness. There is no right CVA tenderness, left CVA tenderness, guarding or rebound.  Musculoskeletal:        General: No swelling or tenderness. Normal range of motion.     Cervical back: Normal range of motion. No rigidity or tenderness.     Right lower leg: No edema.     Left lower leg: No edema.  Lymphadenopathy:     Cervical: No cervical adenopathy.  Skin:  General: Skin is warm and dry.     Coloration: Skin is not pale.     Findings: No bruising, erythema, lesion or rash.  Neurological:     Mental Status: She is alert and oriented to person, place, and time.     Cranial Nerves: No cranial nerve deficit.     Sensory: No sensory deficit.     Motor: No weakness.     Coordination: Coordination normal.     Gait: Gait normal.  Psychiatric:        Mood and Affect: Mood normal.        Speech: Speech normal.        Behavior: Behavior normal.        Thought Content: Thought content normal.        Judgment: Judgment normal.    Labs reviewed: Recent Labs    09/09/21 0919  NA 144  K 5.0  CL 106  CO2 31  GLUCOSE 96  BUN 14  CREATININE 0.88  CALCIUM 9.5   Recent Labs    09/09/21 0919  AST 18  ALT 12  ALKPHOS 67  BILITOT 0.5  PROT 7.2  ALBUMIN 4.4   Recent Labs    09/09/21 0919  WBC 3.6*  NEUTROABS 1.8  HGB 11.2*  HCT 34.8*  MCV 89.4  PLT 192.0   Lab Results  Component Value Date   TSH 1.67 09/09/2021   No results found for: HGBA1C Lab Results  Component Value Date   CHOL 197 09/09/2021   HDL 71.40 09/09/2021   LDLCALC 110 (H) 09/09/2021   TRIG 77.0 09/09/2021   CHOLHDL 3 09/09/2021    Significant Diagnostic Results in last 30 days:  No results found.  Assessment/Plan 1. Encounter  to establish care with new doctor Immunization reviewed does not recall vaccine whether received will bring immunization card then update record.Recommend fasting lab work.  2. Major depressive disorder, recurrent episode, moderate ([facility]) Mood stable.States unable to take generic Wellbutrin oblong tablet tends to give her headaches prefers round tablets.will resend script.I've advised to discuss this with the pharmacist when picking up medication from pharmacy.  - buPROPion (WELLBUTRIN XL) 300 MG 24 hr tablet; Take 1 tablet (300 mg total) by mouth daily.  Dispense: 90 tablet; Refill: 1  3. Vitamin D deficiency Continue vitamin D supplement  Will check levels with fasting labs.  Family/ staff Communication: Reviewed plan of care with patient verbalized understanding.  Labs/tests ordered: None   Next Appointment :  1 month for annual Physical exam with fasting labs and Pap smear     Sandrea Hughs, NP

## 2022-01-06 ENCOUNTER — Ambulatory Visit (INDEPENDENT_AMBULATORY_CARE_PROVIDER_SITE_OTHER): Payer: Medicaid Other | Admitting: Family

## 2022-01-06 ENCOUNTER — Encounter: Payer: Self-pay | Admitting: Family

## 2022-01-06 ENCOUNTER — Other Ambulatory Visit: Payer: Self-pay

## 2022-01-06 VITALS — BP 120/70 | HR 75 | Temp 97.4°F | Resp 16 | Ht 63.0 in | Wt 206.2 lb

## 2022-01-06 DIAGNOSIS — Z0000 Encounter for general adult medical examination without abnormal findings: Secondary | ICD-10-CM | POA: Diagnosis not present

## 2022-01-06 DIAGNOSIS — Z1231 Encounter for screening mammogram for malignant neoplasm of breast: Secondary | ICD-10-CM

## 2022-01-06 DIAGNOSIS — Z6835 Body mass index (BMI) 35.0-35.9, adult: Secondary | ICD-10-CM

## 2022-01-06 DIAGNOSIS — Z113 Encounter for screening for infections with a predominantly sexual mode of transmission: Secondary | ICD-10-CM

## 2022-01-06 DIAGNOSIS — E559 Vitamin D deficiency, unspecified: Secondary | ICD-10-CM | POA: Diagnosis not present

## 2022-01-06 DIAGNOSIS — Z1159 Encounter for screening for other viral diseases: Secondary | ICD-10-CM

## 2022-01-06 DIAGNOSIS — E669 Obesity, unspecified: Secondary | ICD-10-CM

## 2022-01-06 DIAGNOSIS — Z124 Encounter for screening for malignant neoplasm of cervix: Secondary | ICD-10-CM | POA: Diagnosis not present

## 2022-01-06 NOTE — Progress Notes (Signed)
Provider: [name]   Khayree Delellis, Donalee Citrin, NP  Patient Care Team: Levante Simones, Donalee Citrin, NP as PCP - General (Family Medicine)  Extended Emergency Contact Information Primary Emergency Contact: Alguire,Moriah Address: [address]          [address]          [place], [place] [address] [place] Home Phone: [phone] Relation: Daughter  Code Status:  Full Code Goals of care: Advanced Directive information Advanced Directives 01/06/2022  Does Patient Have a Medical Advance Directive? No  Would patient like information on creating a medical advance directive? No - Patient declined     Chief Complaint  Patient presents with   Annual Exam    Complete Physical Exam.   Health Maintenance    Discuss the need for HIV screening, Mammogram, Colonoscopy, Pap smear, and Hepatitis C Screening.    Immunizations    Discuss the need for Shingrix vaccine, Tetanus vaccine, and Covid Booster.     HPI:  Pt is a 61 y.o. female seen today for Annual Physical examination and Pap smear.  She complains of little scratchy throat today but denies any fever,chills, cough or sore throat. She is due for Shingrix ,COVID and tetanus vaccine. She was advised on the previous visit to get vaccine at her pharmacy She is also due for hep C and HIV screening.  She was referred to GI for colonoscopy on previous visit.Will order mammogram screening today.      Past Medical History:  Diagnosis Date   Depression    History of colonoscopy 2010   [place]   Past Surgical History:  Procedure Laterality Date   CESAREAN SECTION      No Known Allergies  Allergies as of 01/06/2022   No Known Allergies      Medication List        Accurate as of January 06, 2022 10:37 AM. If you have any questions, ask your nurse or doctor.          buPROPion 300 MG 24 hr tablet Commonly known as: WELLBUTRIN XL Take 1 tablet (300 mg total) by mouth daily.   Vitamin D (Ergocalciferol) 1.25 MG (50000  UNIT) Caps capsule Commonly known as: DRISDOL Take 50,000 Units by mouth once a week.        Review of Systems  Constitutional:  Negative for appetite change, chills, fatigue, fever and unexpected weight change.  HENT:  Negative for congestion, dental problem, ear discharge, ear pain, facial swelling, hearing loss, nosebleeds, postnasal drip, rhinorrhea, sinus pressure, sinus pain, sneezing, sore throat, tinnitus and trouble swallowing.   Eyes:  Negative for pain, discharge, redness, itching and visual disturbance.  Respiratory:  Negative for cough, chest tightness, shortness of breath and wheezing.   Cardiovascular:  Negative for chest pain, palpitations and leg swelling.  Gastrointestinal:  Negative for abdominal distention, abdominal pain, blood in stool, constipation, diarrhea, nausea and vomiting.  Endocrine: Negative for cold intolerance, heat intolerance, polydipsia, polyphagia and polyuria.  Genitourinary:  Negative for difficulty urinating, dysuria, flank pain, frequency and urgency.  Musculoskeletal:  Negative for arthralgias, back pain, gait problem, joint swelling, myalgias, neck pain and neck stiffness.  Skin:  Negative for color change, pallor, rash and wound.  Neurological:  Negative for dizziness, syncope, speech difficulty, weakness, light-headedness, numbness and headaches.  Hematological:  Does not bruise/bleed easily.  Psychiatric/Behavioral:  Negative for agitation, behavioral problems, confusion, hallucinations, self-injury, sleep disturbance and suicidal ideas. The patient is not nervous/anxious.  Immunization History  Administered Date(s) Administered   Moderna Sars-Covid-2 Vaccination 01/08/2020, 02/05/2020   Td 10/26/1990, 11/11/2007   Pertinent  Health Maintenance Due  Topic Date Due   COLONOSCOPY (Pts 45-37yrs Insurance coverage will need to be confirmed)  Never done   PAP SMEAR-Modifier  09/25/2009   MAMMOGRAM  Never done   INFLUENZA VACCINE  01/23/2022  (Originally 05/26/2021)   Fall Risk 08/22/2021 10/29/2021 12/05/2021 01/06/2022  Falls in the past year? 0 - 0 0  Was there an injury with Fall? - - 0 0  Fall Risk Category Calculator - - 0 0  Fall Risk Category - - Low Low  Patient Fall Risk Level - Low fall risk Low fall risk Low fall risk  Patient at Risk for Falls Due to No Fall Risks - No Fall Risks No Fall Risks  Fall risk Follow up - - Falls evaluation completed Falls evaluation completed   Functional Status Survey:    Vitals:   01/06/22 1010  BP: 120/70  Pulse: 75  Resp: 16  Temp: (!) 97.4 F (36.3 C)  SpO2: 97%  Weight: 206 lb 3.2 oz (93.5 kg)  Height: 5\' 3"  (1.6 m)   Body mass index is 36.53 kg/m. Physical Exam Vitals reviewed. Exam conducted with a chaperone present (Jasmine dillard,CMA).  Constitutional:      General: She is not in acute distress.    Appearance: Normal appearance. She is normal weight. She is not ill-appearing or diaphoretic.  HENT:     Head: Normocephalic.     Right Ear: Tympanic membrane, ear canal and external ear normal. There is no impacted cerumen.     Left Ear: Tympanic membrane, ear canal and external ear normal. There is no impacted cerumen.     Nose: Nose normal. No congestion or rhinorrhea.     Mouth/Throat:     Mouth: Mucous membranes are moist.     Pharynx: Oropharynx is clear. No oropharyngeal exudate or posterior oropharyngeal erythema.  Eyes:     General: No scleral icterus.       Right eye: No discharge.        Left eye: No discharge.     Extraocular Movements: Extraocular movements intact.     Conjunctiva/sclera: Conjunctivae normal.     Pupils: Pupils are equal, round, and reactive to light.  Neck:     Vascular: No carotid bruit.  Cardiovascular:     Rate and Rhythm: Normal rate and regular rhythm.     Pulses: Normal pulses.     Heart sounds: Normal heart sounds. No murmur heard.   No friction rub. No gallop.  Pulmonary:     Effort: Pulmonary effort is normal. No  respiratory distress.     Breath sounds: Normal breath sounds. No wheezing, rhonchi or rales.  Chest:     Chest wall: No tenderness.  Breasts:    Right: No bleeding, mass, nipple discharge, skin change or tenderness.     Left: No bleeding, mass, nipple discharge, skin change or tenderness.  Abdominal:     General: Bowel sounds are normal. There is no distension.     Palpations: Abdomen is soft. There is no mass.     Tenderness: There is no abdominal tenderness. There is no right CVA tenderness, left CVA tenderness, guarding or rebound.  Genitourinary:    Cervix: Normal.     Uterus: Normal.      Adnexa: Right adnexa normal and left adnexa normal.  Musculoskeletal:  General: No swelling or tenderness. Normal range of motion.     Cervical back: Normal range of motion. No rigidity or tenderness.     Right lower leg: No edema.     Left lower leg: No edema.  Lymphadenopathy:     Cervical: No cervical adenopathy.  Skin:    General: Skin is warm and dry.     Coloration: Skin is not pale.     Findings: No bruising, erythema, lesion or rash.  Neurological:     Mental Status: She is alert and oriented to person, place, and time.     Cranial Nerves: No cranial nerve deficit.     Sensory: No sensory deficit.     Motor: No weakness.     Coordination: Coordination normal.     Gait: Gait normal.  Psychiatric:        Mood and Affect: Mood normal.        Speech: Speech normal.        Behavior: Behavior normal.        Thought Content: Thought content normal.        Judgment: Judgment normal.    Labs reviewed: Recent Labs    09/09/21 0919  NA 144  K 5.0  CL 106  CO2 31  GLUCOSE 96  BUN 14  CREATININE 0.88  CALCIUM 9.5   Recent Labs    09/09/21 0919  AST 18  ALT 12  ALKPHOS 67  BILITOT 0.5  PROT 7.2  ALBUMIN 4.4   Recent Labs    09/09/21 0919  WBC 3.6*  NEUTROABS 1.8  HGB 11.2*  HCT 34.8*  MCV 89.4  PLT 192.0   Lab Results  Component Value Date   TSH 1.67  09/09/2021   No results found for: HGBA1C Lab Results  Component Value Date   CHOL 197 09/09/2021   HDL 71.40 09/09/2021   LDLCALC 110 (H) 09/09/2021   TRIG 77.0 09/09/2021   CHOLHDL 3 09/09/2021    Significant Diagnostic Results in last 30 days:  No results found.  Assessment/Plan 1. Papanicolaou smear for cervical cancer screening Negative exam findings. Chaperone present during exam. - PAP, Image Guided [[facility]]  2. Screening examination for sexually transmitted disease Reports no high risk behaviors - HIV antibody (with reflex)  3. Need for hepatitis C screening test Low risk - Hep C Antibody  4. Annual physical exam Immunization reviewed due for tetanus shot, Shingrix and COVID-19 vaccine recommended fasting blood work today.Annual physical examination recommended - CBC with Differential/Platelet - CMP with eGFR([facility]) - TSH - Lipid Panel  5. Vitamin D deficiency Continue on vitamin D supplements. - Vitamin D, 1,25-dihydroxy  6. BMI 35.0-35.9,adult BMI 36.53 Dietary modification and exercise at least 3 times per week for 30 minutes - CBC with Differential/Platelet - CMP with eGFR([facility]) - TSH - Lipid Panel  7. Obesity (BMI 35.0-39.9 without comorbidity) BMI 36.53 Diet modification and exercise as above - CBC with Differential/Platelet - CMP with eGFR([facility]) - TSH - Lipid Panel  8. Breast cancer screening by mammogram On symptomatic.Her mother has history of breast cancer.  Recommended breast self exam at home - MM Digital Diagnostic Bilat; Future  Family/ staff Communication: Reviewed plan of care with patient verbalized understanding  Labs/tests ordered:  - CBC with Differential/Platelet - CMP with eGFR([facility]) - TSH - Lipid Panel - MM Digital Diagnostic Bilat; Future - Vitamin D, 1,25-dihydroxy - Hep C Antibody - HIV antibody (with reflex) - PAP, Image Guided [[facility]]  Next Appointment :  1 year for annual physical  examination with fasting labs  Caesar Bookman, NP

## 2022-01-08 LAB — PAP IG (IMAGE GUIDED)

## 2022-01-09 LAB — CBC WITH DIFFERENTIAL/PLATELET
Absolute Monocytes: 184 cells/uL — ABNORMAL LOW (ref 200–950)
Basophils Absolute: 20 cells/uL (ref 0–200)
Basophils Relative: 0.6 %
Eosinophils Absolute: 61 cells/uL (ref 15–500)
Eosinophils Relative: 1.8 %
HCT: 37.2 % (ref 35.0–45.0)
Hemoglobin: 12.2 g/dL (ref 11.7–15.5)
Lymphs Abs: 1340 cells/uL (ref 850–3900)
MCH: 28.8 pg (ref 27.0–33.0)
MCHC: 32.8 g/dL (ref 32.0–36.0)
MCV: 87.9 fL (ref 80.0–100.0)
MPV: 11.7 fL (ref 7.5–12.5)
Monocytes Relative: 5.4 %
Neutro Abs: 1795 cells/uL (ref 1500–7800)
Neutrophils Relative %: 52.8 %
Platelets: 233 10*3/uL (ref 140–400)
RBC: 4.23 10*6/uL (ref 3.80–5.10)
RDW: 14.2 % (ref 11.0–15.0)
Total Lymphocyte: 39.4 %
WBC: 3.4 10*3/uL — ABNORMAL LOW (ref 3.8–10.8)

## 2022-01-09 LAB — LIPID PANEL
Cholesterol: 210 mg/dL — ABNORMAL HIGH (ref <200)
HDL: 80 mg/dL (ref >=50)
LDL Cholesterol (Calc): 113 mg/dL (calc) — ABNORMAL HIGH
Non-HDL Cholesterol (Calc): 130 mg/dL (calc) — ABNORMAL HIGH (ref <130)
Total CHOL/HDL Ratio: 2.6 (calc) (ref <5.0)
Triglycerides: 71 mg/dL (ref <150)

## 2022-01-09 LAB — HEPATITIS C ANTIBODY
Hepatitis C Ab: NONREACTIVE
SIGNAL TO CUT-OFF: <0.02 (ref <1.00)

## 2022-01-09 LAB — COMPLETE METABOLIC PANEL WITH GFR
AG Ratio: 1.6 (calc) (ref 1.0–2.5)
ALT: 11 U/L (ref 6–29)
AST: 18 U/L (ref 10–35)
Albumin: 4.5 g/dL (ref 3.6–5.1)
Alkaline phosphatase (APISO): 71 U/L (ref 37–153)
BUN: 13 mg/dL (ref 7–25)
CO2: 28 mmol/L (ref 20–32)
Calcium: 9.7 mg/dL (ref 8.6–10.4)
Chloride: 103 mmol/L (ref 98–110)
Creat: 0.91 mg/dL (ref 0.50–1.03)
Globulin: 2.8 g/dL (calc) (ref 1.9–3.7)
Glucose, Bld: 90 mg/dL (ref 65–99)
Potassium: 4.9 mmol/L (ref 3.5–5.3)
Sodium: 142 mmol/L (ref 135–146)
Total Bilirubin: 0.4 mg/dL (ref 0.2–1.2)
Total Protein: 7.3 g/dL (ref 6.1–8.1)
eGFR: 73 mL/min/{1.73_m2} (ref >=60)

## 2022-01-09 LAB — HIV ANTIBODY (ROUTINE TESTING W REFLEX): HIV 1&2 Ab, 4th Generation: NONREACTIVE

## 2022-01-09 LAB — VITAMIN D 1,25 DIHYDROXY
Vitamin D 1, 25 (OH)2 Total: 45 pg/mL (ref 18–72)
Vitamin D2 1, 25 (OH)2: 33 pg/mL
Vitamin D3 1, 25 (OH)2: 12 pg/mL

## 2022-01-09 LAB — TSH: TSH: 1.65 mIU/L (ref 0.40–4.50)

## 2022-01-13 ENCOUNTER — Other Ambulatory Visit: Payer: Self-pay

## 2022-01-13 DIAGNOSIS — E7800 Pure hypercholesterolemia, unspecified: Secondary | ICD-10-CM

## 2022-01-13 MED ORDER — METRONIDAZOLE 500 MG PO TABS: 500.0000 mg | ORAL_TABLET | Freq: Two times a day (BID) | ORAL | 0 refills | Status: AC

## 2022-03-09 DIAGNOSIS — F331 Major depressive disorder, recurrent, moderate: Secondary | ICD-10-CM

## 2022-03-10 ENCOUNTER — Telehealth: Payer: Self-pay | Admitting: Family

## 2022-03-10 DIAGNOSIS — Z1211 Encounter for screening for malignant neoplasm of colon: Secondary | ICD-10-CM

## 2022-03-10 MED ORDER — BUPROPION HCL ER (XL) 300 MG PO TB24: 300.0000 mg | ORAL_TABLET | Freq: Every day | ORAL | 1 refills | Status: DC

## 2022-03-10 NOTE — Telephone Encounter (Signed)
Will place a referral to Gastroenterologist office then specialist office will call you to schedule for colonosocopy.  ?

## 2022-04-04 DIAGNOSIS — F330 Major depressive disorder, recurrent, mild: Secondary | ICD-10-CM | POA: Diagnosis not present

## 2022-04-07 ENCOUNTER — Other Ambulatory Visit: Payer: Self-pay | Admitting: Family

## 2022-04-07 DIAGNOSIS — E7800 Pure hypercholesterolemia, unspecified: Secondary | ICD-10-CM

## 2022-04-08 ENCOUNTER — Other Ambulatory Visit: Payer: Medicaid Other

## 2022-04-08 DIAGNOSIS — E7800 Pure hypercholesterolemia, unspecified: Secondary | ICD-10-CM | POA: Diagnosis not present

## 2022-04-09 LAB — LIPID PANEL
Cholesterol: 212 mg/dL — ABNORMAL HIGH (ref <200)
HDL: 77 mg/dL (ref >=50)
LDL Cholesterol (Calc): 115 mg/dL (calc) — ABNORMAL HIGH
Non-HDL Cholesterol (Calc): 135 mg/dL (calc) — ABNORMAL HIGH (ref <130)
Total CHOL/HDL Ratio: 2.8 (calc) (ref <5.0)
Triglycerides: 97 mg/dL (ref <150)

## 2022-04-10 DIAGNOSIS — F330 Major depressive disorder, recurrent, mild: Secondary | ICD-10-CM | POA: Diagnosis not present

## 2022-04-11 DIAGNOSIS — F330 Major depressive disorder, recurrent, mild: Secondary | ICD-10-CM | POA: Diagnosis not present

## 2022-04-23 DIAGNOSIS — F330 Major depressive disorder, recurrent, mild: Secondary | ICD-10-CM | POA: Diagnosis not present

## 2022-05-16 LAB — GLUCOSE, POCT (MANUAL RESULT ENTRY): POC Glucose: 111 mg/dl — AB (ref 70–99)

## 2022-06-09 ENCOUNTER — Telehealth: Payer: Self-pay | Admitting: Family

## 2022-06-09 NOTE — Telephone Encounter (Signed)
..   Medicaid Managed Care   Unsuccessful Outreach Note  06/09/2022 Name: Evangelene Vora MRN: [id number] DOB: [birth year]  Referred by: Sandrea Hughs, NP Reason for referral : High Risk Managed Medicaid (I called the patient today to get her scheduled with the MM Team. I left my name and number on her VM.)   An unsuccessful telephone outreach was attempted today. The patient was referred to the case management team for assistance with care management and care coordination.   Follow Up Plan: The care management team will reach out to the patient again over the next 14 days.   [place]

## 2022-06-25 ENCOUNTER — Other Ambulatory Visit: Payer: Self-pay

## 2022-06-25 ENCOUNTER — Encounter: Payer: Self-pay | Admitting: Emergency Medicine

## 2022-06-25 ENCOUNTER — Encounter: Payer: Self-pay | Admitting: Family

## 2022-06-25 ENCOUNTER — Ambulatory Visit
Admission: EM | Admit: 2022-06-25 | Discharge: 2022-06-25 | Disposition: A | Discharge status: Home / Self Care | Payer: Medicaid Other | Attending: Physician Assistant | Admitting: Physician Assistant

## 2022-06-25 DIAGNOSIS — Z113 Encounter for screening for infections with a predominantly sexual mode of transmission: Secondary | ICD-10-CM | POA: Diagnosis not present

## 2022-06-25 NOTE — Discharge Instructions (Signed)
Monitor your MyChart for your results.  Use condoms with each sexual encounter.  Obtain from sex and to receive your results.  If you develop any symptoms please return for reevaluation.

## 2022-06-25 NOTE — ED Provider Notes (Signed)
[facility]    CSN: [id number] Arrival date & time: 06/25/22  1826      History   Chief Complaint Chief Complaint  Patient presents with   SEXUALLY TRANSMITTED DISEASE    STD panel testing - Entered by patient    HPI Susan [name] is a 61 y.o. female.   Patient presents today for evaluation of sexually transmitted infections.  She reports that she is sexually active with female partners but does not consistently use condoms as she was in a committed relationship.  She has an STI in the past but reports completing treatment.  She denies any changes to personal hygiene products including soaps or detergents.  Denies any recent antibiotic use.  She denies any current symptoms including abdominal pain, pelvic pain, fever, nausea, vomiting, vaginal discharge, urinary symptoms.      Past Medical History:  Diagnosis Date   Depression    History of colonoscopy 2010   [place]    Patient Active Problem List   Diagnosis Date Noted   Encounter to establish care with new doctor 08/22/2021   Annual physical exam 08/22/2021   Major depressive disorder, recurrent episode, moderate ([place]) 08/22/2021   Near syncope 08/22/2021   STREP THROAT 03/30/2008   ECZEMA 03/30/2008   COCCYGEAL PAIN 08/02/2007   DYSPNEA ON EXERTION 08/02/2007   FIBROIDS, UTERUS 08/01/2007   ANEMIA-IRON DEFICIENCY 08/01/2007   DEPRESSION 08/01/2007   BACTERIAL VAGINITIS 05/18/2007    Past Surgical History:  Procedure Laterality Date   CESAREAN SECTION      OB History   No obstetric history on file.      Home Medications    Prior to Admission medications   Medication Sig Start Date End Date Taking? Authorizing Provider  ascorbic acid (VITAMIN C) 500 MG tablet Take 1 tablet (500 mg total) by mouth daily. 01/13/22   Ngetich, Dinah C, NP  buPROPion (WELLBUTRIN XL) 300 MG 24 hr tablet Take 1 tablet (300 mg total) by mouth daily. 03/10/22   Ngetich, Dinah C, NP  Vitamin D, Ergocalciferol,  (DRISDOL) 1.25 MG (50000 UNIT) CAPS capsule Take 50,000 Units by mouth once a week. 06/06/21   [provider]    Family History Family History  Problem Relation Age of Onset   Hyperlipidemia Mother    Heart disease Mother    Heart attack Mother    Heart attack Father    Stroke Father    Hypertension Father    Hypertension Sister    Hypertension Sister    Cancer Brother    Alcohol abuse Brother    HIV Brother     Social History Social History   Tobacco Use   Smoking status: Never   Smokeless tobacco: Never  Vaping Use   Vaping Use: Never used  Substance Use Topics   Alcohol use: Never   Drug use: Never     Allergies   Patient has no known allergies.   Review of Systems Review of Systems  Constitutional:  Negative for activity change, appetite change, fatigue and fever.  Gastrointestinal:  Negative for abdominal pain, diarrhea, nausea and vomiting.  Genitourinary:  Negative for dysuria, frequency, genital sores, pelvic pain, urgency, vaginal bleeding, vaginal discharge and vaginal pain.     Physical Exam Triage Vital Signs ED Triage Vitals  Enc Vitals Group     BP 06/25/22 1841 136/85     Pulse Rate 06/25/22 1841 63     Resp 06/25/22 1841 20     Temp  06/25/22 1841 98.2 F (36.8 C)     Temp Source 06/25/22 1841 Oral     SpO2 06/25/22 1841 97 %     Weight --      Height --      Head Circumference --      Peak Flow --      Pain Score 06/25/22 1838 5     Pain Loc --      Pain Edu? --      Excl. in [place]? --    No data found.  Updated Vital Signs BP 136/85 (BP Location: Left Arm) Comment (BP Location): large cuff  Pulse 63   Temp 98.2 F (36.8 C) (Oral)   Resp 20   SpO2 97%   Visual Acuity Right Eye Distance:   Left Eye Distance:   Bilateral Distance:    Right Eye Near:   Left Eye Near:    Bilateral Near:     Physical Exam Vitals reviewed.  Constitutional:      General: She is awake. She is not in acute distress.    Appearance:  Normal appearance. She is well-developed. She is not ill-appearing.     Comments: Very pleasant female presented age in no acute distress sitting comfortably in exam room  HENT:     Head: Normocephalic and atraumatic.  Cardiovascular:     Rate and Rhythm: Normal rate and regular rhythm.     Heart sounds: Normal heart sounds, S1 normal and S2 normal. No murmur heard. Pulmonary:     Effort: Pulmonary effort is normal.     Breath sounds: Normal breath sounds. No wheezing, rhonchi or rales.     Comments: Clear to auscultation bilaterally Abdominal:     General: Bowel sounds are normal.     Palpations: Abdomen is soft.     Tenderness: There is no abdominal tenderness. There is no right CVA tenderness, left CVA tenderness, guarding or rebound.     Comments: Benign abdominal exam  Genitourinary:    Comments: Exam deferred Psychiatric:        Behavior: Behavior is cooperative.      UC Treatments / Results  Labs (all labs ordered are listed, but only abnormal results are displayed) Labs Reviewed  HIV ANTIBODY (ROUTINE TESTING W REFLEX)  RPR  CERVICOVAGINAL ANCILLARY ONLY    EKG   Radiology No results found.  Procedures Procedures (including critical care time)  Medications Ordered in UC Medications - No data to display  Initial Impression / Assessment and Plan / UC Course  I have reviewed the triage vital signs and the nursing notes.  Pertinent labs & imaging results that were available during my care of the patient were reviewed by me and considered in my medical decision making (see chart for details).     Patient is currently symptomatic so we will defer treatment until results are available.  STI swab collected today.  HIV and syphilis testing was obtained-results pending.  Patient had negative hepatitis C in March 2023 so this was not repeated.  Patient had some questions about herpes testing.  Discussed that we typically only test genital lesions as this is most  definitive way to test for this condition.  Patient denies any current symptoms.  Discussed that IgG testing is not particularly helpful and recommended that she return if she develops any lesions so that we can test this specifically.  Discussed the importance of safe sex practices.  If she develops any symptoms she is to return for  reevaluation.  Strict return precautions given to which she expressed understanding.  Final Clinical Impressions(s) / UC Diagnoses   Final diagnoses:  Routine screening for STI (sexually transmitted infection)     Discharge Instructions      Monitor your MyChart for your results.  Use condoms with each sexual encounter.  Obtain from sex and to receive your results.  If you develop any symptoms please return for reevaluation.     ED Prescriptions   None    PDMP not reviewed this encounter.   Terrilee Croak, PA-C 06/25/22 1857

## 2022-06-25 NOTE — ED Triage Notes (Signed)
Patient wants to get std testing .  Denies symptoms.

## 2022-06-27 LAB — RPR: RPR Ser Ql: NONREACTIVE

## 2022-06-27 LAB — HIV ANTIBODY (ROUTINE TESTING W REFLEX): HIV Screen 4th Generation wRfx: NONREACTIVE

## 2022-06-30 ENCOUNTER — Other Ambulatory Visit: Payer: Self-pay | Admitting: Obstetrics and Gynecology

## 2022-06-30 LAB — CERVICOVAGINAL ANCILLARY ONLY
Bacterial Vaginitis (gardnerella): POSITIVE — AB
Candida Glabrata: NEGATIVE
Candida Vaginitis: NEGATIVE
Chlamydia: NEGATIVE
Comment: NEGATIVE
Comment: NEGATIVE
Comment: NEGATIVE
Comment: NEGATIVE
Comment: NEGATIVE
Comment: NORMAL
Neisseria Gonorrhea: NEGATIVE
Trichomonas: NEGATIVE

## 2022-06-30 NOTE — Patient Instructions (Signed)
Hi Ms. Balthazar, I am very sorry to have missed you today, I hope you are well - as a part of your Medicaid benefit, you are eligible for care management and care coordination services at no cost or copay. I was unable to reach you by phone today but would be happy to help you with your health related needs. Please feel free to call me @ [phone].  A member of the Managed Medicaid care management team will reach out to you again over the next 30 business  days.   Aida Raider RN, BSN [place]  [facility] - Managed Medicaid High Risk [phone].

## 2022-06-30 NOTE — Patient Outreach (Signed)
Care Coordination  06/30/2022  [place] 1961/06/25 [id number]   Medicaid Managed Care   Unsuccessful Outreach Note  06/30/2022 Name: Susan [name] MRN: [id number] DOB: [birth year]  Referred by: Sandrea Hughs, NP Reason for referral : High Risk Managed Medicaid (Unsuccessful telephone outreach)   An unsuccessful telephone outreach was attempted today. The patient was referred to the case management team for assistance with care management and care coordination.   Follow Up Plan: The care management team will reach out to the patient again over the next 30 business  days.   Aida Raider RN, BSN [place]  [facility] - Managed Medicaid High Risk [phone]

## 2022-07-01 ENCOUNTER — Telehealth (HOSPITAL_COMMUNITY): Payer: Self-pay | Admitting: Emergency Medicine

## 2022-07-01 ENCOUNTER — Other Ambulatory Visit: Payer: Self-pay | Admitting: Obstetrics and Gynecology

## 2022-07-01 MED ORDER — METRONIDAZOLE 500 MG PO TABS: 500.0000 mg | ORAL_TABLET | Freq: Two times a day (BID) | ORAL | 0 refills | Status: DC

## 2022-07-01 NOTE — Patient Outreach (Signed)
Care Coordination  07/01/2022  [place] 05/29/1961 [id number]  RNCM returned patient's phone call.  No answer, left message.  Aida Raider RN, BSN   [facility] - Managed Medicaid High Risk [phone].

## 2022-07-01 NOTE — Patient Instructions (Signed)
Hi Ms. Berling, I am sorry I missed you today - as a part of your Medicaid benefit, you are eligible for care management and care coordination services at no cost or copay. I was unable to reach you by phone today but would be happy to help you with your health related needs. Please feel free to call me at [phone].  A member of the Managed Medicaid care management team will reach out to you again over the next 30 business  days.   Aida Raider RN, BSN [place]  [facility] - Managed Medicaid High Risk [phone]

## 2022-07-02 ENCOUNTER — Telehealth: Payer: Self-pay | Admitting: Family

## 2022-07-02 ENCOUNTER — Other Ambulatory Visit: Payer: Self-pay | Admitting: Obstetrics and Gynecology

## 2022-07-02 NOTE — Patient Outreach (Signed)
Care Coordination  07/02/2022  [place] 08/13/1961 [id number]  RNCM returned patient's phone call, no answer and left message.  Aida Raider RN, BSN [place]  [facility] - Managed Medicaid High Risk [phone].

## 2022-07-02 NOTE — Telephone Encounter (Signed)
FYI  Called patient to schedule OV for gastro referral. Patient wants a colonoscopy by the end of September due to insurance ending later this month. Had previous referral from former PCP but this has been closed.

## 2022-07-02 NOTE — Patient Instructions (Signed)
Hi Susan [name] am sorry I missed you again today  - as a part of your Medicaid benefit, you are eligible for care management and care coordination services at no cost or copay. I was unable to reach you by phone today but would be happy to help you with your health related needs. Please feel free to call me at [phone].  A member of the Managed Medicaid care management team will reach out to you again over the next 30 business  days.   Aida Raider RN, BSN [place]  [facility] - Managed Medicaid High Risk [phone]

## 2022-07-03 NOTE — Telephone Encounter (Signed)
Patient returned call. I informed patient of message below and she verbalized understanding. I offered patient an OV with pcp for 09/11 but she declined. States she will try to find a provider closer so she wouldn't have to take off of work due to available times.

## 2022-07-20 ENCOUNTER — Encounter: Payer: Self-pay | Admitting: *Deleted

## 2022-08-03 ENCOUNTER — Other Ambulatory Visit: Payer: Self-pay | Admitting: Obstetrics and Gynecology

## 2022-08-03 NOTE — Patient Outreach (Signed)
Care Coordination  08/03/2022  [place] [birth year] [id number]   Medicaid Managed Care   Unsuccessful Outreach Note  08/03/2022 Name: Susan [name] MRN: [id number] DOB: 1961/09/17  Referred by: Jeanie Sewer, NP Reason for referral : High Risk Managed Medicaid (Unsuccessful telephone outreach)  A second unsuccessful telephone outreach was attempted today. The patient was referred to the case management team for assistance with care management and care coordination.   Follow Up Plan: The care management team will reach out to the patient again over the next 30 business  days.   Aida Raider RN, BSN [place]  [facility] - Managed Medicaid High Risk [phone].

## 2022-08-03 NOTE — Patient Instructions (Signed)
Hi Ms. Raisanen, I am sorry we were unable to reach you today- as a part of your Medicaid benefit, you are eligible for care management and care coordination services at no cost or copay. I was unable to reach you by phone today but would be happy to help you with your health related needs. Please feel free to call me at [phone].  A member of the Managed Medicaid care management team will reach out to you again over the next 30 business days.   Aida Raider RN, BSN [place]  [facility] - Managed Medicaid High Risk [phone].

## 2022-08-21 DIAGNOSIS — Z1231 Encounter for screening mammogram for malignant neoplasm of breast: Secondary | ICD-10-CM | POA: Diagnosis not present

## 2022-09-04 ENCOUNTER — Ambulatory Visit: Payer: Medicaid Other | Admitting: Obstetrics and Gynecology

## 2022-09-22 ENCOUNTER — Other Ambulatory Visit: Payer: Medicaid Other | Admitting: Obstetrics and Gynecology

## 2022-09-22 NOTE — Patient Outreach (Cosign Needed)
Care Coordination  09/22/2022  [place] [birth year] [id number]   Medicaid Managed Care   Unsuccessful Outreach Note  09/22/2022 Name: Susan [name] MRN: [id number] DOB: 1961-02-01  Referred by: Jeanie Sewer, NP Reason for referral : High Risk Managed Medicaid (Unsuccessful telephone outreach)  Third unsuccessful telephone outreach was attempted today. The patient was referred to the case management team for assistance with care management and care coordination. The patient's primary care provider has been notified of our unsuccessful attempts to make or maintain contact with the patient. The care management team is pleased to engage with this patient at any time in the future should he/she be interested in assistance from the care management team.   Follow Up Plan: We have been unable to make contact with the patient. The care management team is available to follow up with the patient after provider conversation with the patient regarding recommendation for care management engagement and subsequent re-referral to the care management team.   Aida Raider RN, BSN [place] Management Coordinator - Managed [facility] High Risk [phone].

## 2022-09-22 NOTE — Patient Instructions (Signed)
Hi Ms. Weiss am sorry we have missed you, I hope you are doing okay- as a part of your Medicaid benefit, you are eligible for care management and care coordination services at no cost or copay. I was unable to reach you by phone today but would be happy to help you with your health related needs. Please feel free to call me at [phone].  Aida Raider RN, BSN   [facility] - Managed Medicaid High Risk [phone]

## 2022-10-08 ENCOUNTER — Encounter: Payer: Self-pay | Admitting: *Deleted

## 2022-10-22 ENCOUNTER — Encounter: Payer: Self-pay | Admitting: Family

## 2022-10-22 ENCOUNTER — Ambulatory Visit (INDEPENDENT_AMBULATORY_CARE_PROVIDER_SITE_OTHER): Payer: Medicaid Other | Admitting: Family

## 2022-10-22 VITALS — BP 136/88 | HR 75 | Temp 97.5°F | Resp 16 | Ht 63.0 in | Wt 200.0 lb

## 2022-10-22 DIAGNOSIS — Z1211 Encounter for screening for malignant neoplasm of colon: Secondary | ICD-10-CM

## 2022-10-22 DIAGNOSIS — E785 Hyperlipidemia, unspecified: Secondary | ICD-10-CM

## 2022-10-22 DIAGNOSIS — E559 Vitamin D deficiency, unspecified: Secondary | ICD-10-CM | POA: Diagnosis not present

## 2022-10-22 DIAGNOSIS — L989 Disorder of the skin and subcutaneous tissue, unspecified: Secondary | ICD-10-CM

## 2022-10-22 MED ORDER — ASCORBIC ACID 500 MG PO TABS: 500.0000 mg | ORAL_TABLET | Freq: Every day | ORAL | 0 refills | Status: DC

## 2022-10-22 MED ORDER — VITAMIN D (ERGOCALCIFEROL) 1.25 MG (50000 UNIT) PO CAPS: 50000.0000 [IU] | ORAL_CAPSULE | ORAL | 0 refills | Status: DC

## 2022-10-22 NOTE — Progress Notes (Signed)
Provider: Marlowe Sax FNP-C  Lajuanna Pompa, Nelda Bucks, NP  Patient Care Team: Madeeha Costantino, Nelda Bucks, NP as PCP - General (Family Medicine)  Extended Emergency Contact Information Primary Emergency Contact: Dixson,Moriah Address: [address]          [address] [place], [place] [address] [place] Phone: [phone] Relation: Daughter  Code Status:  Full Code  Goals of care: Advanced Directive information    10/22/2022   10:37 AM  Advanced Directives  Does Patient Have a Medical Advance Directive? No  Would patient like information on creating a medical advance directive? No - Patient declined     Chief Complaint  Patient presents with   Acute Visit    Patient is requesting Cholesterol check, and Vaginal exam. Patient states she has bump in vaginal area.    HPI:  Pt is a 61 y.o. female seen today for an acute visit for evaluation of bump on vaginal area x 2 days.States noticed some brownish discharge on her underpants.Bump described as stingy sensation. She denies any fever,chills,or abdominal pain.Concerned that partner might be seeing someone else.worried about STD's.Had RPR and HIV test done in 12/2021 and 05/2022 which were all normal.  Also request new order for GI referral for colonoscopy Colonoscopy ordered on previous visit but was out of town did not get it done.    Past Medical History:  Diagnosis Date   Depression    History of colonoscopy 2010   [place]   Past Surgical History:  Procedure Laterality Date   CESAREAN SECTION      No Known Allergies  Outpatient Encounter Medications as of 10/22/2022  Medication Sig   ascorbic acid (VITAMIN C) 500 MG tablet Take 1 tablet (500 mg total) by mouth daily.   buPROPion (WELLBUTRIN XL) 300 MG 24 hr tablet Take 1 tablet (300 mg total) by mouth daily.   Vitamin D, Ergocalciferol, (DRISDOL) 1.25 MG (50000 UNIT) CAPS capsule Take 50,000 Units by mouth once a week.   [DISCONTINUED] metroNIDAZOLE  (FLAGYL) 500 MG tablet Take 1 tablet (500 mg total) by mouth 2 (two) times daily.   No facility-administered encounter medications on file as of 10/22/2022.    Review of Systems  Constitutional:  Negative for appetite change, chills, fatigue, fever and unexpected weight change.  Respiratory:  Negative for cough, chest tightness, shortness of breath and wheezing.   Cardiovascular:  Negative for chest pain, palpitations and leg swelling.  Gastrointestinal:  Negative for abdominal distention, abdominal pain, constipation, nausea and vomiting.  Genitourinary:  Negative for difficulty urinating, dysuria, flank pain, frequency, hematuria, urgency, vaginal bleeding and vaginal pain.  Musculoskeletal:  Negative for arthralgias, back pain, gait problem, joint swelling, myalgias, neck pain and neck stiffness.  Skin:  Negative for color change, pallor, rash and wound.       Bump on vaginal area   Neurological:  Negative for dizziness, light-headedness and headaches.    Immunization History  Administered Date(s) Administered   Moderna Sars-Covid-2 Vaccination 01/08/2020, 02/05/2020   Td 10/26/1990, 11/11/2007   Pertinent  Health Maintenance Due  Topic Date Due   COLONOSCOPY (Pts 45-86yr Insurance coverage will need to be confirmed)  Never done   PAP SMEAR-Modifier  09/25/2009   MAMMOGRAM  Never done   INFLUENZA VACCINE  Never done      10/29/2021    3:57 PM 12/05/2021   11:27 AM 01/06/2022    8:41 AM 06/25/2022  6:40 PM 10/22/2022   10:37 AM  Fall Risk  Falls in the past year?  0 0  0  Was there an injury with Fall?  0 0  0  Fall Risk Category Calculator  0 0  0  Fall Risk Category  Low Low  Low  Patient Fall Risk Level Low fall risk Low fall risk Low fall risk Low fall risk Low fall risk  Patient at Risk for Falls Due to  No Fall Risks No Fall Risks  No Fall Risks  Fall risk Follow up  Falls evaluation completed Falls evaluation completed  Falls evaluation completed   Functional  Status Survey:    Vitals:   10/22/22 1036  BP: 136/88  Pulse: 75  Resp: 16  Temp: (!) 97.5 F (36.4 C)  SpO2: 98%  Weight: 200 lb (90.7 kg)  Height: '5\' 3"'$  (1.6 m)   Body mass index is 35.43 kg/m. Physical Exam Vitals reviewed.  Constitutional:      General: She is not in acute distress.    Appearance: Normal appearance. She is obese. She is not ill-appearing or diaphoretic.  HENT:     Head: Normocephalic.  Eyes:     General: No scleral icterus.       Right eye: No discharge.        Left eye: No discharge.     Conjunctiva/sclera: Conjunctivae normal.     Pupils: Pupils are equal, round, and reactive to light.  Cardiovascular:     Rate and Rhythm: Normal rate and regular rhythm.     Pulses: Normal pulses.     Heart sounds: Normal heart sounds. No murmur heard.    No friction rub. No gallop.  Pulmonary:     Effort: Pulmonary effort is normal. No respiratory distress.     Breath sounds: Normal breath sounds. No wheezing, rhonchi or rales.  Chest:     Chest wall: No tenderness.  Abdominal:     General: Bowel sounds are normal. There is no distension.     Palpations: Abdomen is soft. There is no mass.     Tenderness: There is no abdominal tenderness. There is no right CVA tenderness, left CVA tenderness, guarding or rebound.  Genitourinary:    General: Normal vulva.     Comments: Eroded skin lesion on right labia at 11 O'clock position without any drainage.wound bed red in color.surrounding skin without any erythema or swelling.  Musculoskeletal:        General: No swelling or tenderness. Normal range of motion.     Right lower leg: No edema.     Left lower leg: No edema.  Skin:    General: Skin is warm and dry.     Coloration: Skin is not pale.     Findings: Lesion present. No erythema or rash.     Comments: Eroded skin lesion on right labia   Neurological:     Mental Status: She is alert and oriented to person, place, and time.     Motor: No weakness.     Gait:  Gait normal.  Psychiatric:        Mood and Affect: Mood normal.        Speech: Speech normal.        Behavior: Behavior normal.    Labs reviewed: Recent Labs    01/06/22 1149  NA 142  K 4.9  CL 103  CO2 28  GLUCOSE 90  BUN 13  CREATININE 0.91  CALCIUM 9.7  Recent Labs    01/06/22 1149  AST 18  ALT 11  BILITOT 0.4  PROT 7.3   Recent Labs    01/06/22 1149  WBC 3.4*  NEUTROABS 1,795  HGB 12.2  HCT 37.2  MCV 87.9  PLT 233   Lab Results  Component Value Date   TSH 1.65 01/06/2022   No results found for: "HGBA1C" Lab Results  Component Value Date   CHOL 212 (H) 04/08/2022   HDL 77 04/08/2022   LDLCALC 115 (H) 04/08/2022   TRIG 97 04/08/2022   CHOLHDL 2.8 04/08/2022    Significant Diagnostic Results in last 30 days:  No results found.  Assessment/Plan 1. Bumps on skin Afebrile Eroded skin lesion on right labia at 11 O'clock position without any drainage.wound bed red in color.surrounding skin without any erythema or swelling. Chaperone Jasmine Dillard,CMA present throughout the exam.suspect possible eroded acne verse Herpes simplex lesion.will obtain labs as below.  - HSV(herpes simplex vrs) 1+2 ab-IgG  2. Vitamin D deficiency Continue vit D supplement  - Vitamin D, Ergocalciferol, (DRISDOL) 1.25 MG (50000 UNIT) CAPS capsule; Take 1 capsule (50,000 Units total) by mouth once a week.  Dispense: 5 capsule; Refill: 0  3. Hyperlipidemia LDL goal <100 Previous LDL not at goal  Continue dietary modification and exercise  - Lipid Panel  4. Colon cancer screening Colonoscopy ordered on previous visit but was out of town did not get it done.  - Ambulatory referral to Gastroenterology  Family/ staff Communication: Reviewed plan of care with patient verbalized understanding.  Labs/tests ordered:  - HSV(herpes simplex vrs) 1+2 ab-IgG - Lipid Panel  Next AppointmentNone : Return if symptoms worsen or fail to improve.   Sandrea Hughs, NP

## 2022-10-23 LAB — HSV(HERPES SIMPLEX VRS) I + II AB-IGG
HAV 1 IGG,TYPE SPECIFIC AB: <0.9 index
HSV 2 IGG,TYPE SPECIFIC AB: 3.82 index — ABNORMAL HIGH

## 2022-10-23 LAB — LIPID PANEL
Cholesterol: 200 mg/dL — ABNORMAL HIGH (ref <200)
HDL: 84 mg/dL (ref >=50)
LDL Cholesterol (Calc): 100 mg/dL (calc) — ABNORMAL HIGH
Non-HDL Cholesterol (Calc): 116 mg/dL (calc) (ref <130)
Total CHOL/HDL Ratio: 2.4 (calc) (ref <5.0)
Triglycerides: 72 mg/dL (ref <150)

## 2022-11-10 ENCOUNTER — Other Ambulatory Visit: Payer: Self-pay

## 2022-11-10 MED ORDER — VALACYCLOVIR HCL 1 G PO TABS: 1000.0000 mg | ORAL_TABLET | Freq: Two times a day (BID) | ORAL | 0 refills | Status: AC

## 2022-11-16 ENCOUNTER — Telehealth: Payer: Self-pay

## 2022-11-16 NOTE — Telephone Encounter (Signed)
Please schedule video or office visit.

## 2022-11-16 NOTE — Telephone Encounter (Signed)
Message received on sticky not from Fraser Din (Licensed conveyancer) stating patient would like a return call and only wants to speak with [place]. Patient refused to be transferred to Clinical intake.  I called patient in an attempt to see if I could assist her or take a detailed message to relay to [place].  Patient answered and stated I only want to speak with Dinah and I informed her that I will send a message to [facility] and she ended the call abruptly.

## 2022-12-01 ENCOUNTER — Other Ambulatory Visit: Payer: Self-pay | Admitting: Family

## 2022-12-01 DIAGNOSIS — E559 Vitamin D deficiency, unspecified: Secondary | ICD-10-CM

## 2022-12-02 ENCOUNTER — Encounter: Payer: Self-pay | Admitting: Family

## 2022-12-29 ENCOUNTER — Encounter: Payer: Self-pay | Admitting: Internal Medicine

## 2022-12-31 ENCOUNTER — Telehealth: Payer: Self-pay | Admitting: *Deleted

## 2022-12-31 NOTE — Telephone Encounter (Signed)
Patient has Lab appointment scheduled on 01/08/2023 for labwork but no orders are in Epic to have drawn.  Please place orders for patient's lab appointment on 3/15. Thanks.

## 2022-12-31 NOTE — Telephone Encounter (Signed)
Has labs and physical same day.will order labs during visit.

## 2023-01-01 NOTE — Telephone Encounter (Signed)
Canceled Lab appointment.

## 2023-01-05 ENCOUNTER — Telehealth: Payer: Self-pay

## 2023-01-05 DIAGNOSIS — Z9889 Other specified postprocedural states: Secondary | ICD-10-CM

## 2023-01-05 NOTE — Telephone Encounter (Signed)
Mutiple attempts made to complete PV. Unable to reach patient. VM left. Pt to call the office back by 5 PM to have PV rescheduled. Pt made aware that in the event that we do not hear back from them their PV and scheduled procedure will be cancelled.

## 2023-01-06 ENCOUNTER — Encounter: Payer: Self-pay | Admitting: Internal Medicine

## 2023-01-08 ENCOUNTER — Other Ambulatory Visit: Payer: Medicaid Other

## 2023-01-08 ENCOUNTER — Ambulatory Visit (INDEPENDENT_AMBULATORY_CARE_PROVIDER_SITE_OTHER): Payer: Medicaid Other | Admitting: Family

## 2023-01-08 ENCOUNTER — Encounter: Payer: Self-pay | Admitting: Family

## 2023-01-08 ENCOUNTER — Ambulatory Visit (AMBULATORY_SURGERY_CENTER): Payer: Medicaid Other

## 2023-01-08 VITALS — Ht 63.0 in | Wt 198.0 lb

## 2023-01-08 VITALS — BP 128/78 | HR 62 | Temp 97.4°F | Resp 16 | Ht 63.39 in | Wt 198.8 lb

## 2023-01-08 DIAGNOSIS — Z1211 Encounter for screening for malignant neoplasm of colon: Secondary | ICD-10-CM

## 2023-01-08 DIAGNOSIS — E559 Vitamin D deficiency, unspecified: Secondary | ICD-10-CM

## 2023-01-08 DIAGNOSIS — Z1231 Encounter for screening mammogram for malignant neoplasm of breast: Secondary | ICD-10-CM | POA: Diagnosis not present

## 2023-01-08 DIAGNOSIS — L989 Disorder of the skin and subcutaneous tissue, unspecified: Secondary | ICD-10-CM | POA: Diagnosis not present

## 2023-01-08 DIAGNOSIS — E785 Hyperlipidemia, unspecified: Secondary | ICD-10-CM | POA: Diagnosis not present

## 2023-01-08 DIAGNOSIS — F331 Major depressive disorder, recurrent, moderate: Secondary | ICD-10-CM | POA: Diagnosis not present

## 2023-01-08 DIAGNOSIS — Z124 Encounter for screening for malignant neoplasm of cervix: Secondary | ICD-10-CM | POA: Diagnosis not present

## 2023-01-08 DIAGNOSIS — Z0000 Encounter for general adult medical examination without abnormal findings: Secondary | ICD-10-CM

## 2023-01-08 DIAGNOSIS — H6121 Impacted cerumen, right ear: Secondary | ICD-10-CM | POA: Diagnosis not present

## 2023-01-08 MED ORDER — PEG 3350-KCL-NA BICARB-NACL 420 G PO SOLR: 4000.0000 mL | Freq: Once | ORAL | 0 refills | Status: AC

## 2023-01-08 MED ORDER — DEBROX 6.5 % OT SOLN: 5.0000 [drp] | Freq: Two times a day (BID) | OTIC | 0 refills | Status: DC

## 2023-01-08 NOTE — Progress Notes (Signed)
No egg or soy allergy known to patient  No issues known to pt with past sedation with any surgeries or procedures Patient denies ever being told they had issues or difficulty with intubation  No FH of Malignant Hyperthermia Pt is not on diet pills Pt is not on  home 02  Pt is not on blood thinners  Pt denies issues with constipation  No A fib or A flutter Have any cardiac testing pending--no Pt instructed to use Single[employer].com or GoodRx for a price reduction on prep   

## 2023-01-08 NOTE — Patient Instructions (Addendum)
-   Instill debrox 6.5 otic solution 5 drops into right ear twice daily x 4 days then follow up for ear lavage.May apply cotton ball at bedtime to prevent drainage to pillow.  

## 2023-01-08 NOTE — Progress Notes (Unsigned)
Provider: Marlowe Sax FNP-C   Aiyla Baucom, Nelda Bucks, NP  Patient Care Team: Yesennia Hirota, Nelda Bucks, NP as PCP - General (Family Medicine)  Extended Emergency Contact Information Primary Emergency Contact: Krapf,Moriah Address: [address]          [address] [place], [place] [address] [place] Phone: [phone] Relation: Daughter  Code Status:  Full Code  Goals of care: Advanced Directive information    10/22/2022   10:37 AM  Advanced Directives  Does Patient Have a Medical Advance Directive? No  Would patient like information on creating a medical advance directive? No - Patient declined     Chief Complaint  Patient presents with   Annual Exam    Pap needed today. States mammo was done already- no results in epic but states done at [facility]. Had colonoscopy 7 years ago and has another scheduled in the coming months. Refuses shingles and flu and covid vaccines    HPI:  Pt is a 62 y.o. female seen today for Pap smear.she denies any abdominal pain,vaginal bleeding or discharge.Also no fever chills,nausea or vomiting.  Due for screening with mammogram. She is due for Tdap,Influenza and COVID-19 vaccine but declines.    Past Medical History:  Diagnosis Date   Depression    History of colonoscopy 2010   [place]   Past Surgical History:  Procedure Laterality Date   CESAREAN SECTION      No Known Allergies  Allergies as of 01/08/2023   No Known Allergies      Medication List        Accurate as of January 08, 2023  9:41 AM. If you have any questions, ask your nurse or doctor.          ascorbic acid 500 MG tablet Commonly known as: VITAMIN C Take 1 tablet (500 mg total) by mouth daily.   buPROPion 300 MG 24 hr tablet Commonly known as: WELLBUTRIN XL Take 1 tablet (300 mg total) by mouth daily.   Vitamin D (Ergocalciferol) 1.25 MG (50000 UNIT) Caps capsule Commonly known as: DRISDOL TAKE 1 CAPSULE BY MOUTH 1 TIME A WEEK         Review of Systems  Constitutional:  Negative for appetite change, chills, fatigue and fever.  Gastrointestinal:  Negative for abdominal distention, abdominal pain, constipation, diarrhea, nausea, rectal pain and vomiting.  Genitourinary:  Negative for difficulty urinating, dysuria, flank pain, frequency, hematuria, menstrual problem, pelvic pain, urgency, vaginal bleeding, vaginal discharge and vaginal pain.  Musculoskeletal:  Negative for back pain, gait problem and myalgias.  Skin:  Negative for color change, pallor and rash.    Immunization History  Administered Date(s) Administered   Moderna Sars-Covid-2 Vaccination 01/08/2020, 02/05/2020   Td 10/26/1990, 11/11/2007, 03/29/2018   Pertinent  Health Maintenance Due  Topic Date Due   PAP SMEAR-Modifier  09/25/2009   MAMMOGRAM  Never done   INFLUENZA VACCINE  01/24/2023 (Originally 05/26/2022)   COLONOSCOPY (Pts 45-63yrs Insurance coverage will need to be confirmed)  01/07/2026      12/05/2021   11:27 AM 01/06/2022    8:41 AM 06/25/2022    6:40 PM 10/22/2022   10:37 AM 01/08/2023    8:55 AM  Fall Risk  Falls in the past year? 0 0  0 0  Was there an injury with Fall? 0 0  0 0  Fall Risk Category Calculator 0 0  0 0  Fall Risk Category (Retired)  Low Low  Low   (RETIRED) Patient Fall Risk Level Low fall risk Low fall risk Low fall risk Low fall risk   Patient at Risk for Falls Due to No Fall Risks No Fall Risks  No Fall Risks No Fall Risks  Fall risk Follow up Falls evaluation completed Falls evaluation completed  Falls evaluation completed Falls evaluation completed   Functional Status Survey:    Vitals:   01/08/23 0858  BP: 128/78  Pulse: 62  Resp: 16  Temp: (!) 97.4 F (36.3 C)  TempSrc: Temporal  SpO2: 98%  Weight: 198 lb 12.8 oz (90.2 kg)  Height: 5' 3.39" (1.61 m)   Body mass index is 34.79 kg/m. Physical Exam Vitals reviewed. Exam conducted with a chaperone present Rodena Piety Arriele,CMA).  Constitutional:       General: She is not in acute distress.    Appearance: Normal appearance. She is obese. She is not ill-appearing or diaphoretic.  HENT:     Head: Normocephalic.     Right Ear: Tympanic membrane, ear canal and external ear normal. There is impacted cerumen.     Left Ear: Tympanic membrane, ear canal and external ear normal. There is no impacted cerumen.     Nose: Nose normal. No congestion or rhinorrhea.     Mouth/Throat:     Mouth: Mucous membranes are moist.     Pharynx: Oropharynx is clear. No oropharyngeal exudate or posterior oropharyngeal erythema.  Eyes:     General: No scleral icterus.       Right eye: No discharge.        Left eye: No discharge.     Extraocular Movements: Extraocular movements intact.     Conjunctiva/sclera: Conjunctivae normal.     Pupils: Pupils are equal, round, and reactive to light.  Neck:     Vascular: No carotid bruit.  Cardiovascular:     Rate and Rhythm: Normal rate and regular rhythm.     Pulses: Normal pulses.     Heart sounds: Normal heart sounds. No murmur heard.    No friction rub. No gallop.  Pulmonary:     Effort: Pulmonary effort is normal. No respiratory distress.     Breath sounds: Normal breath sounds. No wheezing, rhonchi or rales.  Chest:     Chest wall: No tenderness.  Breasts:    Right: Normal.     Left: Normal.  Abdominal:     General: Bowel sounds are normal. There is no distension.     Palpations: Abdomen is soft. There is no mass.     Tenderness: There is no abdominal tenderness. There is no right CVA tenderness, left CVA tenderness, guarding or rebound.     Hernia: There is no hernia in the left inguinal area or right inguinal area.  Genitourinary:    Exam position: Lithotomy position.     Labia:        Right: No rash, tenderness or lesion.        Left: No rash, tenderness or lesion.      Urethra: No prolapse, urethral pain, urethral swelling or urethral lesion.     Vagina: Normal.     Cervix: Normal.     Uterus:  Normal.      Adnexa: Right adnexa normal and left adnexa normal.  Musculoskeletal:        General: No swelling or tenderness. Normal range of motion.     Cervical back: Normal range of motion. No rigidity or tenderness.     Right  lower leg: No edema.     Left lower leg: No edema.  Lymphadenopathy:     Cervical: No cervical adenopathy.     Upper Body:     Right upper body: No supraclavicular, axillary or pectoral adenopathy.     Left upper body: No supraclavicular, axillary or pectoral adenopathy.     Lower Body: No right inguinal adenopathy. No left inguinal adenopathy.  Skin:    General: Skin is warm and dry.     Coloration: Skin is not pale.     Findings: No bruising, erythema, lesion or rash.  Neurological:     Mental Status: She is alert and oriented to person, place, and time.     Cranial Nerves: No cranial nerve deficit.     Sensory: No sensory deficit.     Motor: No weakness.     Coordination: Coordination normal.     Gait: Gait normal.  Psychiatric:        Mood and Affect: Mood normal.        Speech: Speech normal.        Behavior: Behavior normal.        Thought Content: Thought content normal.        Judgment: Judgment normal.     Labs reviewed: No results for input(s): "NA", "K", "CL", "CO2", "GLUCOSE", "BUN", "CREATININE", "CALCIUM", "MG", "PHOS" in the last 8760 hours. No results for input(s): "AST", "ALT", "ALKPHOS", "BILITOT", "PROT", "ALBUMIN" in the last 8760 hours. No results for input(s): "WBC", "NEUTROABS", "HGB", "HCT", "MCV", "PLT" in the last 8760 hours. Lab Results  Component Value Date   TSH 1.65 01/06/2022   No results found for: "HGBA1C" Lab Results  Component Value Date   CHOL 200 (H) 10/22/2022   HDL 84 10/22/2022   LDLCALC 100 (H) 10/22/2022   TRIG 72 10/22/2022   CHOLHDL 2.4 10/22/2022    Significant Diagnostic Results in last 30 days:  No results found.  Assessment/Plan 1. Hyperlipidemia LDL goal <100 LDL not at goal   Modification and exercise advised - Lipid panel  2. Major depressive disorder, recurrent episode, moderate (HCC) Mood stable -Continue on bupropion - CBC with Differential/Platelet - COMPLETE METABOLIC PANEL WITH GFR - TSH  3. Vitamin D deficiency Continue on vitamin D supplements - VITAMIN D 25 Hydroxy (Vit-D Deficiency, Fractures)  4. Annual physical exam Declined influenza, Tdap and COVID 19 vaccines. Medication and labs reviewed patient counselled regarding yearly exam, prevention of dental and periodontal disease, diet, regular sustained exercise for at least 30 minutes x 3 /week,recommended schedule for routine labs. Fall screening, MMSE and PHQ 9 up dated.  5. Impacted cerumen of right ear TM not visualized - Advised to Instill debrox 6.5 otic solution 5 drops into right ear twice daily x 4 days then follow up for ear lavage.May apply cotton ball at bedtime to prevent drainage to pillow.   6. Breast cancer screening by mammogram Asymptomatic  - MM DIGITAL SCREENING BILATERAL  7. Bumps on skin Chronic previous labs positive but would like to repeat lab work.  - HSV(herpes simplex vrs) 1+2 ab-IgG  8. Cervical cancer screening Normal pelvic exam no discharge or bleeding noted. Chaperone present throughout the exam  - PAP, IG w/ Reflex HPV when ASCU [[facility]]  Family/ staff Communication: Reviewed plan of care with patient verbalized understanding  Labs/tests ordered:  - PAP, IG w/ Reflex HPV when ASCU [[facility]] - MM DIGITAL SCREENING BILATERAL - CBC with Differential/Platelet - CMP with eGFR([facility]) - TSH - Hgb  A1C - Lipid panel - HSV(herpes simplex vrs) 1+2 ab-IgG  Next Appointment : Return in about 1 year (around 01/08/2024) for annual Physical examination one week for er lavage .   Sandrea Hughs, NP

## 2023-01-09 LAB — HSV(HERPES SIMPLEX VRS) I + II AB-IGG
HAV 1 IGG,TYPE SPECIFIC AB: <0.9 index
HSV 2 IGG,TYPE SPECIFIC AB: 3.11 index — ABNORMAL HIGH

## 2023-01-11 LAB — PAP IG W/ RFLX HPV ASCU

## 2023-01-26 ENCOUNTER — Encounter: Payer: Medicaid Other | Admitting: Internal Medicine

## 2023-02-22 ENCOUNTER — Ambulatory Visit (AMBULATORY_SURGERY_CENTER): Payer: Managed Care, Other (non HMO)

## 2023-02-22 VITALS — Ht 63.0 in | Wt 198.0 lb

## 2023-02-22 DIAGNOSIS — Z86010 Personal history of colonic polyps: Secondary | ICD-10-CM

## 2023-02-22 MED ORDER — PEG 3350-KCL-NA BICARB-NACL 420 G PO SOLR: 4000.0000 mL | Freq: Once | ORAL | 0 refills | Status: AC

## 2023-02-22 NOTE — Progress Notes (Signed)

## 2023-03-26 ENCOUNTER — Encounter: Payer: Self-pay | Admitting: Internal Medicine

## 2023-03-26 ENCOUNTER — Ambulatory Visit: Payer: Managed Care, Other (non HMO) | Admitting: Internal Medicine

## 2023-03-26 VITALS — BP 129/72 | HR 67 | Temp 98.0°F | Resp 16 | Ht 63.0 in | Wt 198.0 lb

## 2023-03-26 DIAGNOSIS — D124 Benign neoplasm of descending colon: Secondary | ICD-10-CM

## 2023-03-26 DIAGNOSIS — Z1211 Encounter for screening for malignant neoplasm of colon: Secondary | ICD-10-CM

## 2023-03-26 DIAGNOSIS — K635 Polyp of colon: Secondary | ICD-10-CM | POA: Diagnosis not present

## 2023-03-26 MED ORDER — SODIUM CHLORIDE 0.9 % IV SOLN: 500.0000 mL | Freq: Once | INTRAVENOUS | Status: AC

## 2023-03-26 NOTE — Op Note (Signed)
[facility] Patient Name: Susan [name] Procedure Date: 03/26/2023 8:17 AM MRN: [id number] Endoscopist: Wilhemina Bonito. Marina Goodell , MD, [id number] Age: 62 Referring MD:  Date of Birth: [birth year] Gender: Female Account #: [id number] Procedure:                Colonoscopy with cold snare polypectomy x 1 Indications:              Screening for colorectal malignant neoplasm. The                            patient reports having had a colonoscopy 10 or 15                            years ago in [facility]. No details Medicines:                Monitored Anesthesia Care Procedure:                Pre-Anesthesia Assessment:                           - Prior to the procedure, a History and Physical                            was performed, and patient medications and                            allergies were reviewed. The patient's tolerance of                            previous anesthesia was also reviewed. The risks                            and benefits of the procedure and the sedation                            options and risks were discussed with the patient.                            All questions were answered, and informed consent                            was obtained. Prior Anticoagulants: The patient has                            taken no anticoagulant or antiplatelet agents.                            After reviewing the risks and benefits, the patient                            was deemed in satisfactory condition to undergo the                            procedure.  After obtaining informed consent, the colonoscope                            was passed under direct vision. Throughout the                            procedure, the patient's blood pressure, pulse, and                            oxygen saturations were monitored continuously. The                            CF [id number] #[id number] was introduced through the anus                             and advanced to the the cecum, identified by                            appendiceal orifice and ileocecal valve. The                            ileocecal valve, appendiceal orifice, and rectum                            were photographed. The quality of the bowel                            preparation was excellent. The colonoscopy was                            performed without difficulty. The patient tolerated                            the procedure well. The bowel preparation used was                            SUPREP via split dose instruction. Scope In: 8:29:16 AM Scope Out: 8:39:48 AM Scope Withdrawal Time: 0 hours 8 minutes 15 seconds  Total Procedure Duration: 0 hours 10 minutes 32 seconds  Findings:                 A 6 mm polyp was found in the descending colon. The                            polyp was sessile. The polyp was removed with a                            cold snare. Resection and retrieval were complete.                           The exam was otherwise without abnormality on                            direct and retroflexion  views. Complications:            No immediate complications. Estimated blood loss:                            None. Estimated Blood Loss:     Estimated blood loss: none. Impression:               - One 6 mm polyp in the descending colon, removed                            with a cold snare. Resected and retrieved.                           - The examination was otherwise normal on direct                            and retroflexion views. Recommendation:           - Repeat colonoscopy in 5-10 years for surveillance.                           - Patient has a contact number available for                            emergencies. The signs and symptoms of potential                            delayed complications were discussed with the                            patient. Return to normal activities tomorrow.                            Written  discharge instructions were provided to the                            patient.                           - Resume previous diet.                           - Continue present medications.                           - Await pathology results. Wilhemina Bonito. Marina Goodell, MD 03/26/2023 8:48:34 AM This report has been signed electronically.

## 2023-03-26 NOTE — Progress Notes (Signed)
Called to room to assist during endoscopic procedure.  Patient ID and intended procedure confirmed with present staff. Received instructions for my participation in the procedure from the performing physician.  

## 2023-03-26 NOTE — Progress Notes (Signed)
HISTORY OF PRESENT ILLNESS:  Susan [name] is a 62 y.o. female who presents for routine screening colonoscopy.  No complaints  REVIEW OF SYSTEMS:  All non-GI ROS negative except for  Past Medical History:  Diagnosis Date   Depression    History of colonoscopy 2010   [place]    Past Surgical History:  Procedure Laterality Date   CESAREAN SECTION     COLONOSCOPY      Social History Susan [name]  reports that she has never smoked. She has never used smokeless tobacco. She reports that she does not drink alcohol and does not use drugs.  family history includes Alcohol abuse in her brother; Cancer in her brother; Colon polyps in her mother and sister; HIV in her brother; Heart attack in her father and mother; Heart disease in her mother; Hyperlipidemia in her mother; Hypertension in her father, sister, and sister; Stroke in her father.  No Known Allergies     PHYSICAL EXAMINATION: Vital signs: BP 132/72   Pulse 72   Temp 98 F (36.7 C)   Resp 13   Ht 5\' 3"  (1.6 m)   Wt 198 lb (89.8 kg)   SpO2 98%   BMI 35.07 kg/m  General: Well-developed, well-nourished, no acute distress HEENT: Sclerae are anicteric, conjunctiva pink. Oral mucosa intact Lungs: Clear Heart: Regular Abdomen: soft, nontender, nondistended, no obvious ascites, no peritoneal signs, normal bowel sounds. No organomegaly. Extremities: No edema Psychiatric: alert and oriented x3. Cooperative     ASSESSMENT:   Colon cancer screening  PLAN:  Screening colonoscopy

## 2023-03-26 NOTE — Progress Notes (Signed)
Report to PACU, RN, vss, BBS= Clear.  

## 2023-03-26 NOTE — Patient Instructions (Addendum)
YOU HAD AN ENDOSCOPIC PROCEDURE TODAY AT THE [facility]:   Refer to the procedure report that was given to you for any specific questions about what was found during the examination.  If the procedure report does not answer your questions, please call your gastroenterologist to clarify.  If you requested that your care partner not be given the details of your procedure findings, then the procedure report has been included in a sealed envelope for you to review at your convenience later.  YOU SHOULD EXPECT: Some feelings of bloating in the abdomen. Passage of more gas than usual.  Walking can help get rid of the air that was put into your GI tract during the procedure and reduce the bloating. If you had a lower endoscopy (such as a colonoscopy or flexible sigmoidoscopy) you may notice spotting of blood in your stool or on the toilet paper. If you underwent a bowel prep for your procedure, you may not have a normal bowel movement for a few days.  Please Note:  You might notice some irritation and congestion in your nose or some drainage.  This is from the oxygen used during your procedure.  There is no need for concern and it should clear up in a day or so.  SYMPTOMS TO REPORT IMMEDIATELY:  Following lower endoscopy (colonoscopy or flexible sigmoidoscopy):  Excessive amounts of blood in the stool  Significant tenderness or worsening of abdominal pains  Swelling of the abdomen that is new, acute  Fever of 100F or higher   For urgent or emergent issues, a gastroenterologist can be reached at any hour by calling [phone]. Do not use MyChart messaging for urgent concerns.    DIET:  We do recommend a small meal at first, but then you may proceed to your regular diet.  Drink plenty of fluids but you should avoid alcoholic beverages for 24 hours.  MEDICATIONS: Continue present medications.  Please see handouts given to you by your recovery nurse: Polyps.  FOLLOW UP: Await  pathology results. Repeat colonoscopy in 5-10 years for surveillance based on pathology results.  Thank you for allowing [place] to provide for your healthcare needs today.  ACTIVITY:  You should plan to take it easy for the rest of today and you should NOT DRIVE or use heavy machinery until tomorrow (because of the sedation medicines used during the test).    FOLLOW UP: Our staff will call the number listed on your records the next business day following your procedure.  We will call around 7:15- 8:00 am to check on you and address any questions or concerns that you may have regarding the information given to you following your procedure. If we do not reach you, we will leave a message.     If any biopsies were taken you will be contacted by phone or by letter within the next 1-3 weeks.  Please call us at [phone] if you have not heard about the biopsies in 3 weeks.    SIGNATURES/CONFIDENTIALITY: You and/or your care partner have signed paperwork which will be entered into your electronic medical record.  These signatures attest to the fact that that the information above on your After Visit Summary has been reviewed and is understood.  Full responsibility of the confidentiality of this discharge information lies with you and/or your care-partner.

## 2023-03-26 NOTE — Progress Notes (Signed)
Pt's states no medical or surgical changes since previsit or office visit. 

## 2023-03-29 ENCOUNTER — Telehealth: Payer: Self-pay

## 2023-03-29 NOTE — Telephone Encounter (Signed)
  Follow up Call-     03/26/2023    7:29 AM  Call back number  Post procedure Call Back phone  # [phone]  Permission to leave phone message Yes     Patient questions:  Do you have a fever, pain , or abdominal swelling? No. Pain Score  0 *  Have you tolerated food without any problems? Yes.    Have you been able to return to your normal activities? Yes.    Do you have any questions about your discharge instructions: Diet   No. Medications  No. Follow up visit  No.  Do you have questions or concerns about your Care? No.  Actions: * If pain score is 4 or above: No action needed, pain <4.

## 2023-03-30 ENCOUNTER — Encounter: Payer: Self-pay | Admitting: Internal Medicine

## 2023-05-03 ENCOUNTER — Telehealth: Payer: Managed Care, Other (non HMO)

## 2023-05-03 NOTE — Telephone Encounter (Signed)
Chart review completed for patient. Patient is due for screening mammogram. Mychart message sent to patient to inquire about scheduling mammogram.  Tonjua Rossetti, Population Health Specialist.  

## 2023-06-07 IMAGING — DX DG CHEST 2V
2 series · 2 of 2 positions shown · non-contrast
Comparison: None.

CLINICAL DATA: cough

EXAM:
CHEST - 2 VIEW

[chest pa]
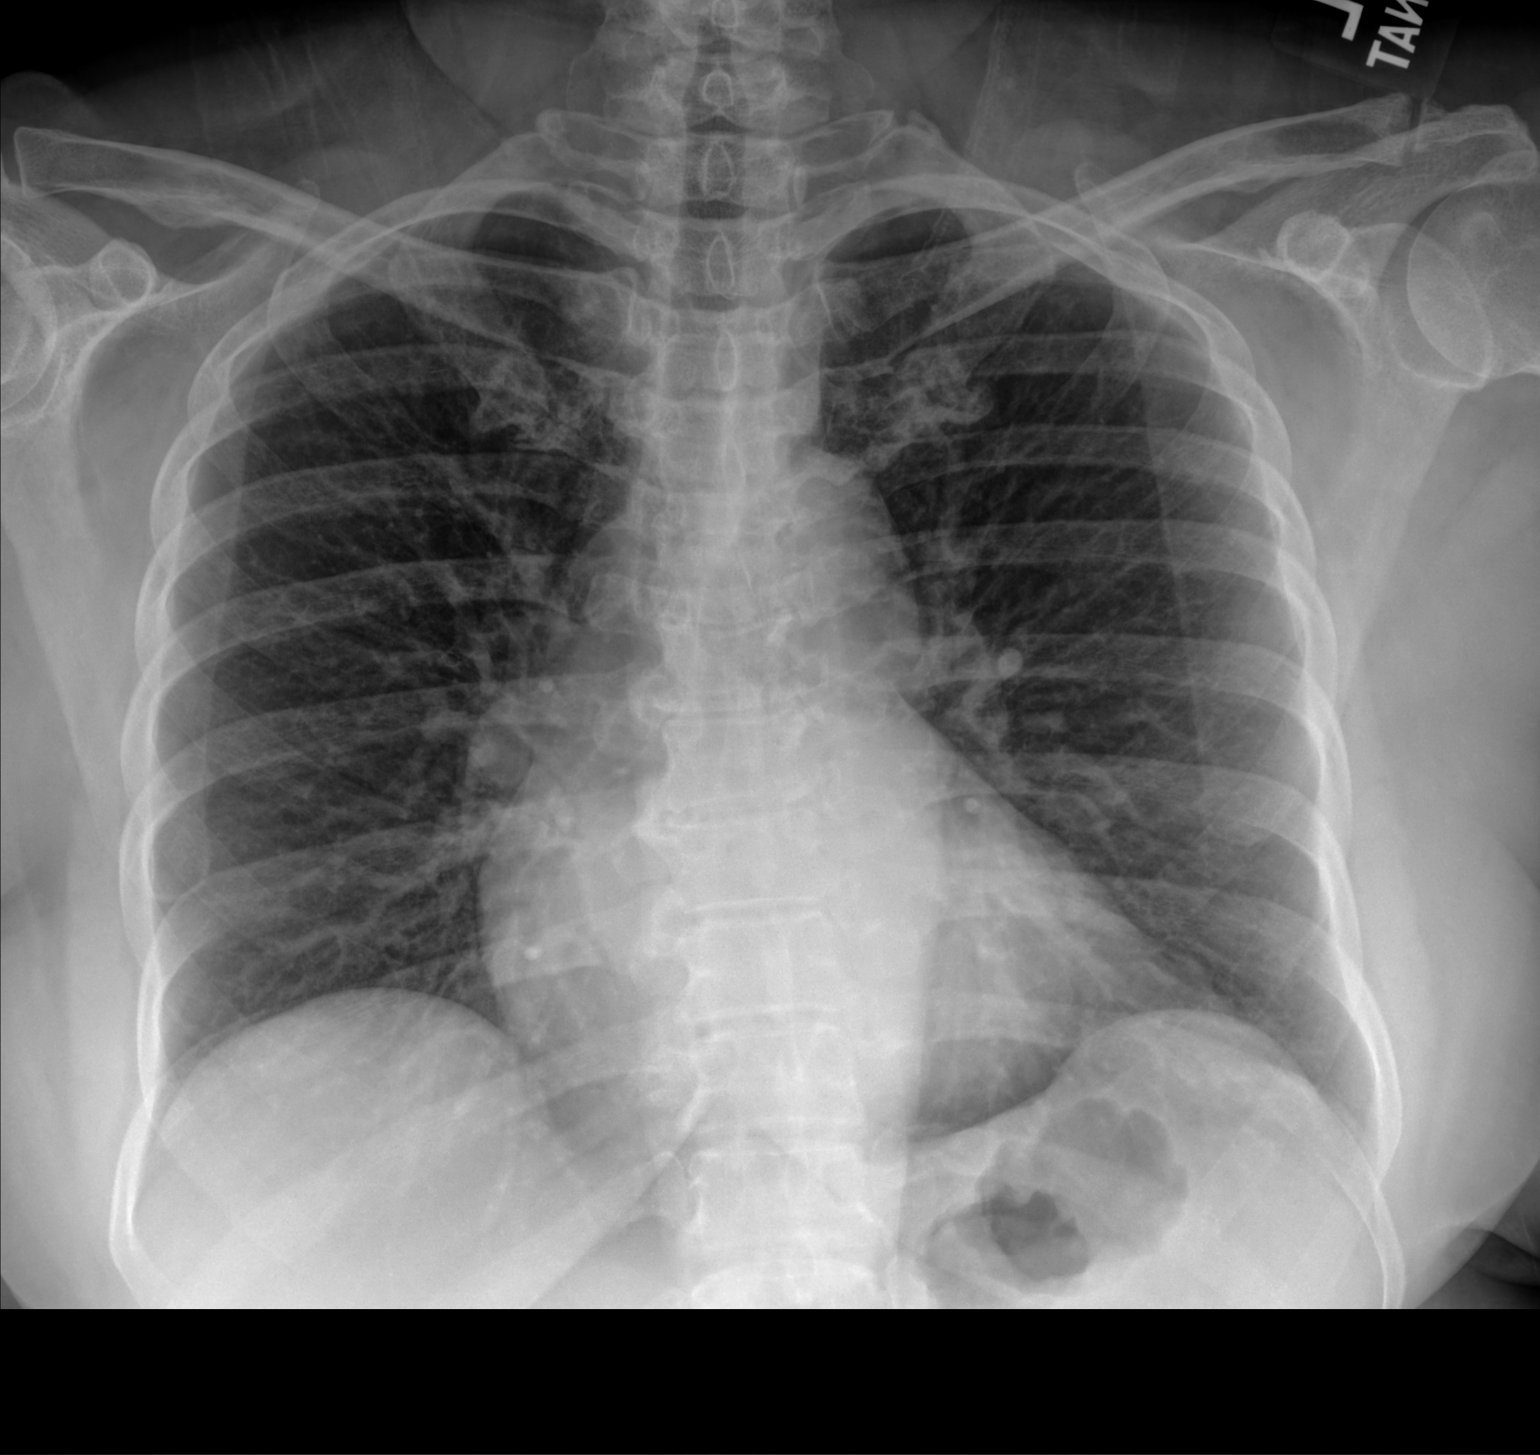

[chest lat]
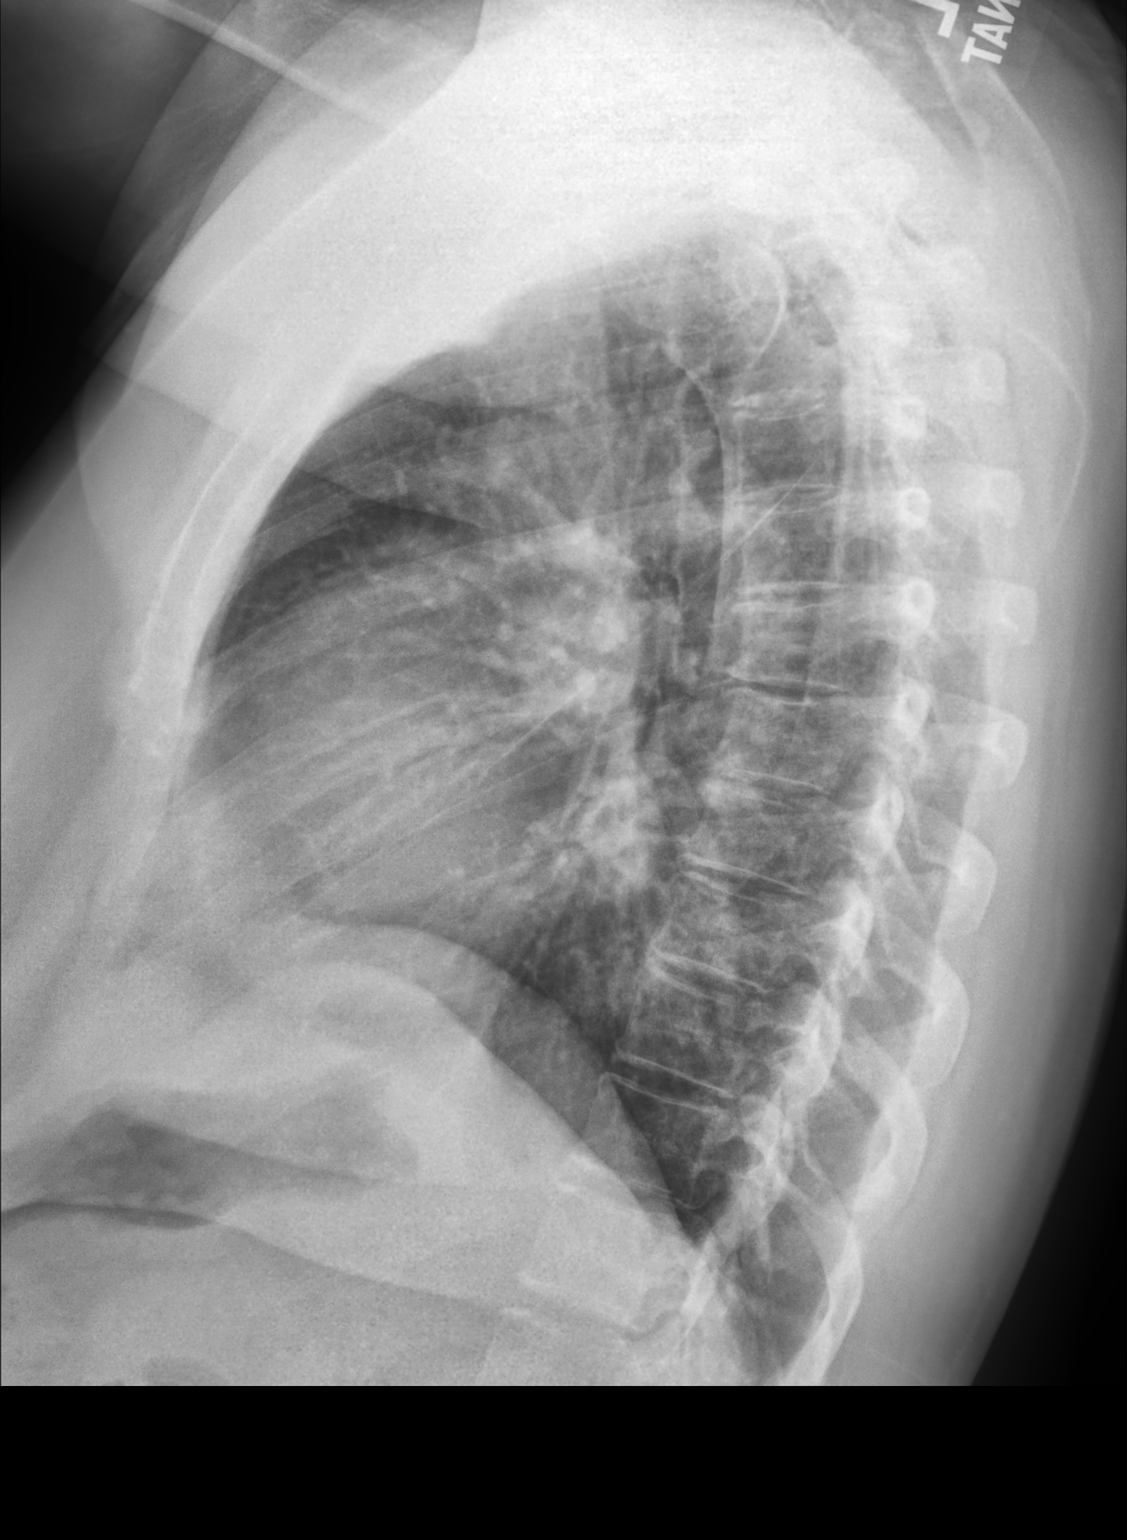

[2 of 2 positions shown; findings below may reference images not displayed]

FINDINGS: No consolidation. No visible pleural effusions or pneumothorax.
Cardiomediastinal silhouette is within normal limits. No evidence of
acute osseous abnormality.
IMPRESSION: No evidence of acute cardiopulmonary disease.

## 2023-12-23 ENCOUNTER — Ambulatory Visit
Admission: RE | Admit: 2023-12-23 | Discharge: 2023-12-23 | Disposition: A | Discharge status: Home / Self Care | Payer: BC Managed Care – PPO | Source: Ambulatory Visit | Attending: Family | Admitting: Family

## 2023-12-23 ENCOUNTER — Encounter: Payer: Self-pay | Admitting: Family

## 2023-12-23 ENCOUNTER — Ambulatory Visit (INDEPENDENT_AMBULATORY_CARE_PROVIDER_SITE_OTHER): Payer: BC Managed Care – PPO | Admitting: Family

## 2023-12-23 VITALS — BP 138/80 | HR 65 | Temp 97.9°F | Resp 20 | Ht 63.0 in | Wt 201.8 lb

## 2023-12-23 DIAGNOSIS — R051 Acute cough: Secondary | ICD-10-CM

## 2023-12-23 DIAGNOSIS — E785 Hyperlipidemia, unspecified: Secondary | ICD-10-CM | POA: Insufficient documentation

## 2023-12-23 DIAGNOSIS — E559 Vitamin D deficiency, unspecified: Secondary | ICD-10-CM

## 2023-12-23 DIAGNOSIS — R0982 Postnasal drip: Secondary | ICD-10-CM | POA: Diagnosis not present

## 2023-12-23 DIAGNOSIS — H6121 Impacted cerumen, right ear: Secondary | ICD-10-CM

## 2023-12-23 DIAGNOSIS — F331 Major depressive disorder, recurrent, moderate: Secondary | ICD-10-CM | POA: Diagnosis not present

## 2023-12-23 DIAGNOSIS — Z2821 Immunization not carried out because of patient refusal: Secondary | ICD-10-CM | POA: Diagnosis not present

## 2023-12-23 MED ORDER — LORATADINE 10 MG PO TABS: 10.0000 mg | ORAL_TABLET | Freq: Every day | ORAL | 11 refills | Status: DC

## 2023-12-23 MED ORDER — DEBROX 6.5 % OT SOLN: 5.0000 [drp] | Freq: Two times a day (BID) | OTIC | 0 refills | Status: AC

## 2023-12-23 NOTE — Patient Instructions (Signed)
 -  Please get chest X-ray at [facility] imaging at [facility] then will call you with results. ? ?

## 2023-12-23 NOTE — Progress Notes (Signed)
 Provider: [name]   Kacie Huxtable, Donalee Citrin, NP  Patient Care Team: Sulo Janczak, Donalee Citrin, NP as PCP - General (Family Medicine)  Extended Emergency Contact Information Primary Emergency Contact: [facility] Address: [address]          [place], [place] [address] [place] of [place] Home Phone: [phone] Relation: Daughter  Code Status:  Full Code  Goals of care: Advanced Directive information    12/23/2023    9:38 AM  Advanced Directives  Does Patient Have a Medical Advance Directive? No  Would patient like information on creating a medical advance directive? Yes (MAU/Ambulatory/Procedural Areas - Information given)     Chief Complaint  Patient presents with   Medical Management of Chronic Issues    SOB upon exertion for about a month. Discuss the need for covid, mammogram, shingles and fiu.      Discussed the use of AI scribe software for clinical note transcription with the patient, who gave verbal consent to proceed.  History of Present Illness   Susan [name] is a 63 year old female who presents with shortness of breath and chest tightness.  For the past month, she has experienced shortness of breath and chest tightness, particularly during physical activities such as walking or climbing stairs. She describes a sensation of congestion in the throat area, which she has been unable to clear despite attempts to cough it up. No wheezing or rattling sounds are present. She recalls having a fever and chills about a month ago, but these symptoms have since resolved.  She reports occasional nasal drainage and a sensation of throat congestion. Her nose runs occasionally, but she has no history of allergies. No current fever or chills. There is a suspicion of post-nasal drip contributing to throat irritation.  She is currently taking Wellbutrin 300 mg for depression, which causes dry mouth. She mentions having 'moments' of depression but does not elaborate  further. She has run out of her vitamin D prescription and is not currently taking vitamin C or using Debrox for her ears.  She has been exposed to secondhand smoke from family members who smoked, although she has never smoked. Her brother has COPD, but she has never been diagnosed with asthma or COPD.  Her eczema only appears during the summer months. She has a history of chickenpox but has not received the shingles vaccine. She is due for several vaccinations, including flu, shingles, and COVID vaccines, but has declined them. She is also due for a mammogram, which is scheduled for March.    Past Medical History:  Diagnosis Date   Depression    History of colonoscopy 2010   [place]   Past Surgical History:  Procedure Laterality Date   CESAREAN SECTION     COLONOSCOPY      No Known Allergies  Allergies as of 12/23/2023   No Known Allergies      Medication List        Accurate as of December 23, 2023 10:14 AM. If you have any questions, ask your nurse or doctor.          STOP taking these medications    ascorbic acid 500 MG tablet Commonly known as: VITAMIN C Stopped by: Jalien Weakland C Stormi Vandevelde       TAKE these medications    buPROPion 300 MG 24 hr tablet Commonly known as: WELLBUTRIN XL Take 1 tablet (300 mg total) by mouth daily.   Debrox 6.5 % OTIC solution Generic drug: carbamide  peroxide Place 5 drops into the right ear 2 (two) times daily for 4 days.   loratadine 10 MG tablet Commonly known as: CLARITIN Take 1 tablet (10 mg total) by mouth daily. Started by: Donalee Citrin Cassia Fein   Vitamin D (Ergocalciferol) 1.25 MG (50000 UNIT) Caps capsule Commonly known as: DRISDOL TAKE 1 CAPSULE BY MOUTH 1 TIME A WEEK        Review of Systems  Constitutional:  Negative for appetite change, chills, fatigue, fever and unexpected weight change.  HENT:  Positive for postnasal drip and rhinorrhea. Negative for congestion, dental problem, ear discharge, ear pain, facial  swelling, hearing loss, nosebleeds, sinus pressure, sinus pain, sneezing, sore throat, tinnitus and trouble swallowing.   Eyes:  Negative for pain, discharge, redness, itching and visual disturbance.  Respiratory:  Positive for cough. Negative for chest tightness, shortness of breath and wheezing.        Shortness of breath with going up the stairs   Cardiovascular:  Negative for chest pain, palpitations and leg swelling.  Gastrointestinal:  Negative for abdominal distention, abdominal pain, blood in stool, constipation, diarrhea, nausea and vomiting.  Endocrine: Negative for cold intolerance, heat intolerance, polydipsia, polyphagia and polyuria.  Genitourinary:  Negative for difficulty urinating, dysuria, flank pain, frequency and urgency.  Musculoskeletal:  Negative for arthralgias, back pain, gait problem, joint swelling, myalgias, neck pain and neck stiffness.  Skin:  Negative for color change, pallor, rash and wound.  Neurological:  Negative for dizziness, syncope, speech difficulty, weakness, light-headedness, numbness and headaches.  Hematological:  Does not bruise/bleed easily.  Psychiatric/Behavioral:  Negative for agitation, behavioral problems, confusion, hallucinations, self-injury, sleep disturbance and suicidal ideas. The patient is not nervous/anxious.     Immunization History  Administered Date(s) Administered   Moderna Sars-Covid-2 Vaccination 01/08/2020, 02/05/2020   Td 10/26/1990, 11/11/2007, 03/29/2018   Pertinent  Health Maintenance Due  Topic Date Due   MAMMOGRAM  Never done   INFLUENZA VACCINE  01/24/2024 (Originally 05/27/2023)   Colonoscopy  03/25/2028      01/06/2022    8:41 AM 06/25/2022    6:40 PM 10/22/2022   10:37 AM 01/08/2023    8:55 AM 12/23/2023    9:37 AM  Fall Risk  Falls in the past year? 0  0 0 0  Was there an injury with Fall? 0  0 0 0  Fall Risk Category Calculator 0  0 0 0  Fall Risk Category (Retired) Low  Low    (RETIRED) Patient Fall Risk  Level Low fall risk Low fall risk Low fall risk    Patient at Risk for Falls Due to No Fall Risks  No Fall Risks No Fall Risks No Fall Risks  Fall risk Follow up Falls evaluation completed  Falls evaluation completed Falls evaluation completed Falls evaluation completed   Functional Status Survey:    Vitals:   12/23/23 0847  BP: 138/80  Pulse: 65  Resp: 20  Temp: 97.9 F (36.6 C)  SpO2: 99%  Weight: 201 lb 12.8 oz (91.5 kg)  Height: 5\' 3"  (1.6 m)   Body mass index is 35.75 kg/m. Physical Exam Vitals reviewed.  Constitutional:      General: She is not in acute distress.    Appearance: Normal appearance. She is obese. She is not ill-appearing or diaphoretic.  HENT:     Head: Normocephalic.     Right Ear: There is impacted cerumen.     Left Ear: Tympanic membrane, ear canal and external ear normal. There is  no impacted cerumen.     Nose: Nose normal. No congestion or rhinorrhea.     Mouth/Throat:     Mouth: Mucous membranes are moist.     Pharynx: Oropharynx is clear. No oropharyngeal exudate or posterior oropharyngeal erythema.  Eyes:     General: No scleral icterus.       Right eye: No discharge.        Left eye: No discharge.     Extraocular Movements: Extraocular movements intact.     Conjunctiva/sclera: Conjunctivae normal.     Pupils: Pupils are equal, round, and reactive to light.  Neck:     Vascular: No carotid bruit.  Cardiovascular:     Rate and Rhythm: Normal rate and regular rhythm.     Pulses: Normal pulses.     Heart sounds: Normal heart sounds. No murmur heard.    No friction rub. No gallop.  Pulmonary:     Effort: Pulmonary effort is normal. No respiratory distress.     Breath sounds: Decreased air movement present. Examination of the left-middle field reveals decreased breath sounds. Examination of the left-lower field reveals decreased breath sounds. Decreased breath sounds present. No wheezing, rhonchi or rales.  Chest:     Chest wall: No tenderness.   Abdominal:     General: Bowel sounds are normal. There is no distension.     Palpations: Abdomen is soft. There is no mass.     Tenderness: There is no abdominal tenderness. There is no right CVA tenderness, left CVA tenderness, guarding or rebound.  Musculoskeletal:        General: No swelling or tenderness. Normal range of motion.     Cervical back: Normal range of motion. No rigidity or tenderness.     Right lower leg: No edema.     Left lower leg: No edema.  Lymphadenopathy:     Cervical: No cervical adenopathy.  Skin:    General: Skin is warm and dry.     Coloration: Skin is not pale.     Findings: No bruising, erythema, lesion or rash.  Neurological:     Mental Status: She is alert and oriented to person, place, and time.     Cranial Nerves: No cranial nerve deficit.     Sensory: No sensory deficit.     Motor: No weakness.     Coordination: Coordination normal.     Gait: Gait normal.  Psychiatric:        Mood and Affect: Mood normal.        Speech: Speech normal.        Behavior: Behavior normal.        Thought Content: Thought content normal.        Judgment: Judgment normal.     Labs reviewed: No results for input(s): "NA", "K", "CL", "CO2", "GLUCOSE", "BUN", "CREATININE", "CALCIUM", "MG", "PHOS" in the last 8760 hours. No results for input(s): "AST", "ALT", "ALKPHOS", "BILITOT", "PROT", "ALBUMIN" in the last 8760 hours. No results for input(s): "WBC", "NEUTROABS", "HGB", "HCT", "MCV", "PLT" in the last 8760 hours. Lab Results  Component Value Date   TSH 1.65 01/06/2022   No results found for: "HGBA1C" Lab Results  Component Value Date   CHOL 200 (H) 10/22/2022   HDL 84 10/22/2022   LDLCALC 100 (H) 10/22/2022   TRIG 72 10/22/2022   CHOLHDL 2.4 10/22/2022    Significant Diagnostic Results in last 30 days:  No results found.  Assessment/Plan  Dyspnea on exertion Experiencing shortness of breath and  chest tightness during activities for the past month.  Lungs clear on examination, some secretions diminished breath sounds noted on the left side. Differential includes possible respiratory infection or other pulmonary issues. Chest x-ray needed to rule out underlying conditions. - Order chest x-ray at [facility] - Provide Claritin or Zyrtec samples for nasal drainage - Advise to take Claritin or Zyrtec for seven days  Chronic nasal drainage Nasal drainage and throat congestion for the past month. No fever or chills. Examination shows no nasal congestion or swollen lymph nodes. Claritin or Zyrtec can help alleviate symptoms by drying up nasal secretions. - Provide Claritin or Zyrtec samples - Advise to take Claritin or Zyrtec for seven days  Cerumen impaction Right ear completely occluded with wax. Previously advised to use Debrox drops but non-compliant. Importance of using ear drops to soften wax before flushing discussed. - Prescribe Debrox drops for right ear - Advise to use Debrox drops for four days and return for ear flushing in one week  Depression Managed with Wellbutrin 300 mg but experiences dry mouth as a side effect. Overall feels well. Continue current medication as effective in managing symptoms. - Continue Wellbutrin 300 mg  Vitamin D deficiency Ran out of high-dose vitamin D (50,000 units). Plan to recheck levels to determine if dose adjustment is needed. Maintaining adequate vitamin D levels is important for bone health and overall well-being. - Order vitamin D level test  General Health Maintenance Due for several immunizations and screenings. Has not taken flu, shingles, or COVID vaccines. Mammogram scheduled for March. Discussed benefits of shingles vaccine and encouraged to get it at the pharmacy. - Encourage shingles vaccine at the pharmacy - Remind to get mammogram in March - Order CBC, CMP, cholesterol, and thyroid tests - Advise to schedule annual physical  Follow-up - Follow up after chest x-ray  results - Follow up after lab work results - Schedule annual physical.   Family/ staff Communication: Reviewed plan of care with patient verbalized understanding   Labs/tests ordered:  - CBC with Differential/Platelet - CMP with eGFR([facility]) - TSH - Hgb A1C - Lipid panel - Vitamin D level   Next Appointment : Return in about 1 year (around 12/22/2024) for annual Physical examination one week for right ear lavage .   Spent 30 minutes of Face to face and non-face to face with patient  >50% time spent counseling; reviewing medical record; tests; labs; documentation and developing future plan of care.   Caesar Bookman, NP

## 2023-12-28 LAB — COMPLETE METABOLIC PANEL WITH GFR
AG Ratio: 1.9 (calc) (ref 1.0–2.5)
ALT: 16 U/L (ref 6–29)
AST: 17 U/L (ref 10–35)
Albumin: 4.4 g/dL (ref 3.6–5.1)
Alkaline phosphatase (APISO): 66 U/L (ref 37–153)
BUN: 11 mg/dL (ref 7–25)
CO2: 30 mmol/L (ref 20–32)
Calcium: 9.7 mg/dL (ref 8.6–10.4)
Chloride: 105 mmol/L (ref 98–110)
Creat: 0.87 mg/dL (ref 0.50–1.05)
Globulin: 2.3 g/dL (ref 1.9–3.7)
Glucose, Bld: 88 mg/dL (ref 65–99)
Potassium: 4.4 mmol/L (ref 3.5–5.3)
Sodium: 143 mmol/L (ref 135–146)
Total Bilirubin: 0.5 mg/dL (ref 0.2–1.2)
Total Protein: 6.7 g/dL (ref 6.1–8.1)
eGFR: 76 mL/min/{1.73_m2} (ref >=60)

## 2023-12-28 LAB — VITAMIN D 1,25 DIHYDROXY
Vitamin D 1, 25 (OH)2 Total: 41 pg/mL (ref 18–72)
Vitamin D2 1, 25 (OH)2: 14 pg/mL
Vitamin D3 1, 25 (OH)2: 27 pg/mL

## 2023-12-28 LAB — CBC WITH DIFFERENTIAL/PLATELET
Absolute Lymphocytes: 1387 {cells}/uL (ref 850–3900)
Absolute Monocytes: 170 {cells}/uL — ABNORMAL LOW (ref 200–950)
Basophils Absolute: 31 {cells}/uL (ref 0–200)
Basophils Relative: 0.9 %
Eosinophils Absolute: 51 {cells}/uL (ref 15–500)
Eosinophils Relative: 1.5 %
HCT: 35.4 % (ref 35.0–45.0)
Hemoglobin: 11.3 g/dL — ABNORMAL LOW (ref 11.7–15.5)
MCH: 28.4 pg (ref 27.0–33.0)
MCHC: 31.9 g/dL — ABNORMAL LOW (ref 32.0–36.0)
MCV: 88.9 fL (ref 80.0–100.0)
MPV: 11.2 fL (ref 7.5–12.5)
Monocytes Relative: 5 %
Neutro Abs: 1761 {cells}/uL (ref 1500–7800)
Neutrophils Relative %: 51.8 %
Platelets: 216 10*3/uL (ref 140–400)
RBC: 3.98 10*6/uL (ref 3.80–5.10)
RDW: 14.7 % (ref 11.0–15.0)
Total Lymphocyte: 40.8 %
WBC: 3.4 10*3/uL — ABNORMAL LOW (ref 3.8–10.8)

## 2023-12-28 LAB — LIPID PANEL
Cholesterol: 220 mg/dL — ABNORMAL HIGH (ref <200)
HDL: 96 mg/dL (ref >=50)
LDL Cholesterol (Calc): 109 mg/dL — ABNORMAL HIGH
Non-HDL Cholesterol (Calc): 124 mg/dL (ref <130)
Total CHOL/HDL Ratio: 2.3 (calc) (ref <5.0)
Triglycerides: 68 mg/dL (ref <150)

## 2023-12-28 LAB — TSH: TSH: 1.25 m[IU]/L (ref 0.40–4.50)

## 2023-12-31 ENCOUNTER — Ambulatory Visit: Payer: BC Managed Care – PPO | Admitting: Family

## 2024-01-03 ENCOUNTER — Other Ambulatory Visit: Payer: Self-pay | Admitting: Family

## 2024-01-03 DIAGNOSIS — E2839 Other primary ovarian failure: Secondary | ICD-10-CM

## 2024-01-03 NOTE — Progress Notes (Unsigned)
 Bone density ordered to be send to [facility] as requested.

## 2024-01-04 ENCOUNTER — Telehealth: Payer: Self-pay

## 2024-01-04 NOTE — Telephone Encounter (Signed)
 Bone density ordered yesterday.please fax order as requested.

## 2024-01-04 NOTE — Telephone Encounter (Signed)
 Copied from CRM [id number]. Topic: Referral - Request for Referral >> Jan 04, 2024  8:50 AM Maxwell Marion wrote: Donn Pierini, with [facility] says patient scheduled herself an appointment for tomorrow at 3pm for a bone density test but they are needing a referral sent to them from the doctor. Fax number is [phone]. They said they need to know why bone density is ordered and the ICD10 code. I do see an order for bone density with that information but maybe they haven't received it yet? Call back number is [phone]. Please Advise   Message sent to Nget[place], Donalee Citrin, NP and Myra Rude

## 2024-01-04 NOTE — Telephone Encounter (Signed)
 Bone density has been faxed as Ngetich, Donalee Citrin, NP request

## 2024-01-05 NOTE — Telephone Encounter (Signed)
 Noted.

## 2024-01-06 DIAGNOSIS — Z1231 Encounter for screening mammogram for malignant neoplasm of breast: Secondary | ICD-10-CM | POA: Diagnosis not present

## 2024-01-06 DIAGNOSIS — N958 Other specified menopausal and perimenopausal disorders: Secondary | ICD-10-CM | POA: Diagnosis not present

## 2024-01-06 LAB — HM MAMMOGRAPHY

## 2024-01-07 ENCOUNTER — Encounter: Payer: Self-pay | Admitting: Family

## 2024-01-12 ENCOUNTER — Ambulatory Visit (INDEPENDENT_AMBULATORY_CARE_PROVIDER_SITE_OTHER): Payer: BC Managed Care – PPO | Admitting: Family

## 2024-01-12 VITALS — BP 132/80 | HR 86 | Temp 97.6°F | Resp 20 | Ht 63.0 in | Wt 201.8 lb

## 2024-01-12 DIAGNOSIS — F418 Other specified anxiety disorders: Secondary | ICD-10-CM | POA: Diagnosis not present

## 2024-01-12 DIAGNOSIS — F5101 Primary insomnia: Secondary | ICD-10-CM | POA: Diagnosis not present

## 2024-01-12 DIAGNOSIS — E559 Vitamin D deficiency, unspecified: Secondary | ICD-10-CM

## 2024-01-12 DIAGNOSIS — E785 Hyperlipidemia, unspecified: Secondary | ICD-10-CM

## 2024-01-12 DIAGNOSIS — F331 Major depressive disorder, recurrent, moderate: Secondary | ICD-10-CM

## 2024-01-12 MED ORDER — VITAMIN D (CHOLECALCIFEROL) 25 MCG (1000 UT) PO TABS
5000.0000 [IU] | ORAL_TABLET | ORAL | 3 refills | Status: AC
Start: 2024-01-12 — End: ?

## 2024-01-12 MED ORDER — HYDROXYZINE HCL 10 MG PO TABS: 10.0000 mg | ORAL_TABLET | Freq: Every evening | ORAL | 0 refills | Status: DC | PRN

## 2024-01-23 NOTE — Progress Notes (Signed)
 Provider: [name]   Daisja Kessinger, Donalee Citrin, NP  Patient Care Team: Avid Guillette, Donalee Citrin, NP as PCP - General (Family Medicine)  Extended Emergency Contact Information Primary Emergency Contact: [facility] Address: [address]          [place], [place] [address] [place] Home Phone: [phone] Relation: Daughter  Code Status:  Full Code  Goals of care: Advanced Directive information    01/12/2024    3:37 PM  Advanced Directives  Does Patient Have a Medical Advance Directive? No  Would patient like information on creating a medical advance directive? No - Patient declined     Chief Complaint  Patient presents with   Annual Exam    Physical     Discussed the use of AI scribe software for clinical note transcription with the patient, who gave verbal consent to proceed.  History of Present Illness   Susan [name] is a 63 year old female who presents for a one year physical exam.  She has experienced a slight weight decrease from 202 lbs to 201.8 lbs since her last visit. She continues to take Wellbutrin 300 mg daily for depression and has not started any new medications or supplements. Her vitamin D level was 41 ng/mL two weeks ago, and she is considering resuming supplementation. She uses Zyrtec for allergy symptoms, which include throat discomfort but no runny nose or congestion. No acid reflux is reported.  Her cholesterol levels have increased, with a total cholesterol of 220 mg/dL, up from 366 mg/dL. Her HDL cholesterol is 96 mg/dL, and her LDL cholesterol has increased to 109 mg/dL from 440 mg/dL. Triglycerides are within normal limits. She has not made significant dietary changes or increased physical activity but acknowledges the need to do so.  She reports difficulty sleeping due to a racing mind, especially with work-related stress and deadlines. She has previously used a sleep aid but cannot recall the name. She is interested in trying a new  medication to help with sleep.  Recent lab work shows normal glucose, kidney, electrolyte, and liver function tests. However, her white blood cell count is slightly low, and she has not been consuming fruits or vitamin C. Her hemoglobin level is 11.3 g/dL, which is slightly low.   Past Medical History:  Diagnosis Date   Depression    History of colonoscopy 2010   [place]   Past Surgical History:  Procedure Laterality Date   CESAREAN SECTION     COLONOSCOPY      No Known Allergies  Allergies as of 01/12/2024   No Known Allergies      Medication List        Accurate as of January 12, 2024 11:59 PM. If you have any questions, ask your nurse or doctor.          STOP taking these medications    loratadine 10 MG tablet Commonly known as: CLARITIN Stopped by: Donalee Citrin [name]   Vitamin D (Ergocalciferol) 1.25 MG (50000 UNIT) Caps capsule Commonly known as: DRISDOL Stopped by: [name]       TAKE these medications    buPROPion 300 MG 24 hr tablet Commonly known as: WELLBUTRIN XL Take 1 tablet (300 mg total) by mouth daily.   hydrOXYzine 10 MG tablet Commonly known as: ATARAX Take 1 tablet (10 mg total) by mouth at bedtime as needed. Started by: Donalee Citrin Norvil Martensen   Vitamin D (Cholecalciferol) 25 MCG (1000 UT) Tabs Take 5,000 Units  by mouth once a week. Started by: Donalee Citrin Rolonda Pontarelli        Review of Systems  Constitutional:  Negative for appetite change, chills, fatigue, fever and unexpected weight change.  HENT:  Negative for congestion, dental problem, ear discharge, ear pain, facial swelling, hearing loss, nosebleeds, postnasal drip, rhinorrhea, sinus pressure, sinus pain, sneezing, sore throat, tinnitus and trouble swallowing.        Seasonal allergies   Eyes:  Negative for pain, discharge, redness, itching and visual disturbance.  Respiratory:  Negative for cough, chest tightness, shortness of breath and wheezing.   Cardiovascular:  Negative for chest  pain, palpitations and leg swelling.  Gastrointestinal:  Negative for abdominal distention, abdominal pain, blood in stool, constipation, diarrhea, nausea and vomiting.  Endocrine: Negative for cold intolerance, heat intolerance, polydipsia, polyphagia and polyuria.  Genitourinary:  Negative for difficulty urinating, dysuria, flank pain, frequency and urgency.  Musculoskeletal:  Negative for arthralgias, back pain, gait problem, joint swelling, myalgias, neck pain and neck stiffness.  Skin:  Negative for color change, pallor, rash and wound.  Neurological:  Negative for dizziness, syncope, speech difficulty, weakness, light-headedness, numbness and headaches.  Hematological:  Does not bruise/bleed easily.  Psychiatric/Behavioral:  Positive for sleep disturbance. Negative for agitation, behavioral problems, confusion, hallucinations, self-injury and suicidal ideas. The patient is not nervous/anxious.     Immunization History  Administered Date(s) Administered   Moderna Sars-Covid-2 Vaccination 01/08/2020, 02/05/2020   Td 10/26/1990, 11/11/2007, 03/29/2018   Pertinent  Health Maintenance Due  Topic Date Due   INFLUENZA VACCINE  01/24/2024 (Originally 05/27/2023)   MAMMOGRAM  01/05/2026   Colonoscopy  03/25/2028      06/25/2022    6:40 PM 10/22/2022   10:37 AM 01/08/2023    8:55 AM 12/23/2023    9:37 AM 01/12/2024    3:36 PM  Fall Risk  Falls in the past year?  0 0 0 0  Was there an injury with Fall?  0 0 0 0  Fall Risk Category Calculator  0 0 0 0  Fall Risk Category (Retired)  Low     (RETIRED) Patient Fall Risk Level Low fall risk Low fall risk     Patient at Risk for Falls Due to  No Fall Risks No Fall Risks No Fall Risks No Fall Risks  Fall risk Follow up  Falls evaluation completed Falls evaluation completed Falls evaluation completed Falls evaluation completed   Functional Status Survey:    Vitals:   01/12/24 1544  BP: 132/80  Pulse: 86  Resp: 20  Temp: 97.6 F (36.4 C)   SpO2: 97%  Weight: 201 lb 12.8 oz (91.5 kg)  Height: 5\' 3"  (1.6 m)   Body mass index is 35.75 kg/m. Physical Exam Vitals reviewed.  Constitutional:      General: She is not in acute distress.    Appearance: Normal appearance. She is obese. She is not ill-appearing or diaphoretic.  HENT:     Head: Normocephalic.     Right Ear: Tympanic membrane, ear canal and external ear normal. There is no impacted cerumen.     Left Ear: Tympanic membrane, ear canal and external ear normal. There is no impacted cerumen.     Nose: Nose normal. No congestion or rhinorrhea.     Mouth/Throat:     Mouth: Mucous membranes are moist.     Pharynx: Oropharynx is clear. No oropharyngeal exudate or posterior oropharyngeal erythema.  Eyes:     General: No scleral icterus.  Right eye: No discharge.        Left eye: No discharge.     Extraocular Movements: Extraocular movements intact.     Conjunctiva/sclera: Conjunctivae normal.     Pupils: Pupils are equal, round, and reactive to light.  Neck:     Vascular: No carotid bruit.  Cardiovascular:     Rate and Rhythm: Normal rate and regular rhythm.     Pulses: Normal pulses.     Heart sounds: Normal heart sounds. No murmur heard.    No friction rub. No gallop.  Pulmonary:     Effort: Pulmonary effort is normal. No respiratory distress.     Breath sounds: Normal breath sounds. No wheezing, rhonchi or rales.  Chest:     Chest wall: No tenderness.  Abdominal:     General: Bowel sounds are normal. There is no distension.     Palpations: Abdomen is soft. There is no mass.     Tenderness: There is no abdominal tenderness. There is no right CVA tenderness, left CVA tenderness, guarding or rebound.  Musculoskeletal:        General: No swelling or tenderness. Normal range of motion.     Cervical back: Normal range of motion. No rigidity or tenderness.     Right lower leg: No edema.     Left lower leg: No edema.  Lymphadenopathy:     Cervical: No  cervical adenopathy.  Skin:    General: Skin is warm and dry.     Coloration: Skin is not pale.     Findings: No bruising, erythema, lesion or rash.  Neurological:     Mental Status: She is alert and oriented to person, place, and time.     Cranial Nerves: No cranial nerve deficit.     Sensory: No sensory deficit.     Motor: No weakness.     Coordination: Coordination normal.     Gait: Gait normal.  Psychiatric:        Mood and Affect: Mood normal.        Speech: Speech normal.        Behavior: Behavior normal.        Thought Content: Thought content normal.        Judgment: Judgment normal.     Labs reviewed: Recent Labs    12/23/23 1017  NA 143  K 4.4  CL 105  CO2 30  GLUCOSE 88  BUN 11  CREATININE 0.87  CALCIUM 9.7   Recent Labs    12/23/23 1017  AST 17  ALT 16  BILITOT 0.5  PROT 6.7   Recent Labs    12/23/23 1017  WBC 3.4*  NEUTROABS 1,761  HGB 11.3*  HCT 35.4  MCV 88.9  PLT 216   Lab Results  Component Value Date   TSH 1.25 12/23/2023   No results found for: "HGBA1C" Lab Results  Component Value Date   CHOL 220 (H) 12/23/2023   HDL 96 12/23/2023   LDLCALC 109 (H) 12/23/2023   TRIG 68 12/23/2023   CHOLHDL 2.3 12/23/2023    Significant Diagnostic Results in last 30 days:  No results found.  Assessment/Plan  Annual Physical Examination Routine annual physical examination with no significant changes or new issues since the last visit. Vital signs are stable. Weight decreased slightly from 202 to 201.8 pounds. Annual Physical Exam  Up to date with immunization except declined Influenza,COVID-19 and shingles vaccine. Medication and labs reviewed patient counselled regarding yearly exam, prevention of dental and  periodontal disease, diet, regular sustained exercise for at least 30 minutes x 3 /week, recommended schedule for routine labs. - Schedule next annual physical examination in one year.  Insomnia Difficulty sleeping due to racing  thoughts and work-related stress. Previously tried a sleep aid but cannot recall the name. Hydroxyzine is considered for its anxiolytic and sedative properties. - Prescribe hydroxyzine to be taken as needed at bedtime for sleep. - Instruct to report back after two weeks to assess effectiveness.  Hyperlipidemia Total cholesterol increased to 220 mg/dL from 147 mg/dL. LDL cholesterol increased to 109 mg/dL from 829 mg/dL, with a goal of keeping it below 100 mg/dL. HDL cholesterol is optimal at 96 mg/dL. Triglycerides are within normal range. Dietary modifications and exercise are advised to manage cholesterol levels. - Advise dietary modifications to reduce LDL cholesterol, including reducing intake of animal products and cheese, and increasing plant-based proteins. - Encourage regular exercise.  Vitamin D Insufficiency Vitamin D level is 41, on the lower side of the normal range. Previous prescription for vitamin D has run out. Recommended to maintain levels to prevent deficiency. - Recommend taking vitamin D 5000 IU once a week or 1000 IU daily.  Leukopenia White blood cell count is slightly low. No current intake of vitamin C or fruits reported. Increasing vitamin C intake is recommended to support immune function. - Recommend increasing intake of fruits high in vitamin C, such as blueberries and kiwis, to support immune function.  Mild Anemia Hemoglobin level is slightly low at 11.3 g/dL. Increasing fruit intake is advised to aid in iron absorption and improve hemoglobin levels. - Encourage consumption of fruits to aid in iron absorption.   Family/ staff Communication: Reviewed plan of care with patient verbalized understanding   Labs/tests ordered: None   Next Appointment : Return in about 1 year (around 01/11/2025) for annual Physical examination.   Spent 30 minutes of Face to face and non-face to face with patient  >50% time spent counseling; reviewing medical record; tests; labs;  documentation and developing future plan of care.   Caesar Bookman, NP

## 2024-02-22 DIAGNOSIS — F331 Major depressive disorder, recurrent, moderate: Secondary | ICD-10-CM

## 2024-02-22 MED ORDER — BUPROPION HCL ER (XL) 300 MG PO TB24: 300.0000 mg | ORAL_TABLET | Freq: Every day | ORAL | 1 refills | Status: DC

## 2024-03-08 ENCOUNTER — Other Ambulatory Visit: Payer: Self-pay | Admitting: Family

## 2024-03-08 DIAGNOSIS — F331 Major depressive disorder, recurrent, moderate: Secondary | ICD-10-CM

## 2024-05-15 ENCOUNTER — Encounter: Payer: Self-pay | Admitting: Family

## 2024-05-15 ENCOUNTER — Ambulatory Visit: Admitting: Family

## 2024-05-15 VITALS — BP 132/80 | HR 70 | Temp 97.6°F | Resp 20 | Ht 63.0 in | Wt 211.8 lb

## 2024-05-15 DIAGNOSIS — E538 Deficiency of other specified B group vitamins: Secondary | ICD-10-CM | POA: Diagnosis not present

## 2024-05-15 DIAGNOSIS — E785 Hyperlipidemia, unspecified: Secondary | ICD-10-CM

## 2024-05-15 DIAGNOSIS — R413 Other amnesia: Secondary | ICD-10-CM | POA: Diagnosis not present

## 2024-05-15 DIAGNOSIS — D72819 Decreased white blood cell count, unspecified: Secondary | ICD-10-CM | POA: Diagnosis not present

## 2024-05-15 DIAGNOSIS — M549 Dorsalgia, unspecified: Secondary | ICD-10-CM | POA: Diagnosis not present

## 2024-05-15 DIAGNOSIS — D509 Iron deficiency anemia, unspecified: Secondary | ICD-10-CM

## 2024-05-15 LAB — VITAMIN B12: Vitamin B-12: 347 pg/mL (ref 200–1100)

## 2024-05-15 LAB — FOLATE: Folate: 11.6 ng/mL

## 2024-05-15 NOTE — Patient Instructions (Signed)
-   Please get Upper back spine X-ray at [facility] imaging at [facility] then will call you with results.

## 2024-05-17 ENCOUNTER — Ambulatory Visit: Payer: Self-pay | Admitting: Family

## 2024-05-21 NOTE — Progress Notes (Signed)
 Provider: Roxan Plough FNP-C  Leoma Folds, Roxan BROCKS, NP  Patient Care Team: Rayven Hendrickson, Roxan BROCKS, NP as PCP - General (Family Medicine)  Extended Emergency Contact Information Primary Emergency Contact: [facility] Address: [address]          [place], [place] [address] [place] Home Phone: [phone] Relation: Daughter  Code Status:  Full Code Goals of care: Advanced Directive information    05/15/2024    2:59 PM  Advanced Directives  Does Patient Have a Medical Advance Directive? No  Would patient like information on creating a medical advance directive? No - Patient declined     Chief Complaint  Patient presents with   memory issues    Pt c/o sensitivity issues in spinal area.    Discussed the use of AI scribe software for clinical note transcription with the patient, who gave verbal consent to proceed.  History of Present Illness   Susan [name] is a 63 year old female who presents with concerns about memory and recall issues.  She has been experiencing significant memory and recall issues over the past year, particularly with tasks at work, such as recalling information from one page to another. She struggles with constructing sentences and sometimes forgets why she entered a room. No issues with forgetting names or daily chores, but she occasionally forgets where she is while driving, although she does not get lost. There is a family history of dementia and Alzheimer's, as her mother was diagnosed in her late 51.  She reports back pain for the past six to nine months, occurring along her mid-back and triggered by minor bumps or pushes. The pain is along the spine and is not associated with movement. She denies any recent falls causing the pain, although she has had falls in the past. No associated neck pain or headaches linked to the back pain.  Her diet is irregular, often eating only one meal a day, and she does not consume alcohol. Previous  lab work indicated high cholesterol levels and mild anemia, with low white blood cell counts.   Past Medical History:  Diagnosis Date   Depression    History of colonoscopy 2010   [place]   Past Surgical History:  Procedure Laterality Date   CESAREAN SECTION     COLONOSCOPY      No Known Allergies  Outpatient Encounter Medications as of 05/15/2024  Medication Sig   buPROPion  (WELLBUTRIN  XL) 300 MG 24 hr tablet TAKE 1 TABLET(300 MG) BY MOUTH DAILY   hydrOXYzine  (ATARAX ) 10 MG tablet Take 1 tablet (10 mg total) by mouth at bedtime as needed.   Vitamin D , Cholecalciferol , 25 MCG (1000 UT) TABS Take 5,000 Units by mouth once a week.   Facility-Administered Encounter Medications as of 05/15/2024  Medication   0.9 %  sodium chloride  infusion    Review of Systems  Constitutional:  Negative for appetite change, chills, fatigue, fever and unexpected weight change.  HENT:  Negative for congestion, dental problem, ear discharge, ear pain, facial swelling, hearing loss, nosebleeds, postnasal drip, rhinorrhea, sinus pressure, sinus pain, sneezing, sore throat, tinnitus and trouble swallowing.   Eyes:  Negative for pain, discharge, redness, itching and visual disturbance.  Respiratory:  Negative for cough, chest tightness, shortness of breath and wheezing.   Cardiovascular:  Negative for chest pain, palpitations and leg swelling.  Gastrointestinal:  Negative for abdominal distention, abdominal pain, blood in stool, constipation, diarrhea, nausea and vomiting.  Genitourinary:  Negative for difficulty urinating,  dysuria, flank pain, frequency and urgency.  Musculoskeletal:  Positive for arthralgias and back pain. Negative for gait problem, joint swelling, myalgias, neck pain and neck stiffness.  Skin:  Negative for color change, pallor, rash and wound.  Neurological:  Negative for dizziness, syncope, speech difficulty, weakness, light-headedness, numbness and headaches.  Hematological:  Does not  bruise/bleed easily.  Psychiatric/Behavioral:  Negative for agitation, behavioral problems, confusion, hallucinations, self-injury, sleep disturbance and suicidal ideas. The patient is not nervous/anxious.        Memory loss    Immunization History  Administered Date(s) Administered   Moderna Sars-Covid-2 Vaccination 01/08/2020, 02/05/2020   Td 10/26/1990, 11/11/2007, 03/29/2018   Pertinent  Health Maintenance Due  Topic Date Due   INFLUENZA VACCINE  05/26/2024   MAMMOGRAM  01/05/2026   Colonoscopy  03/25/2028      10/22/2022   10:37 AM 01/08/2023    8:55 AM 12/23/2023    9:37 AM 01/12/2024    3:36 PM 05/15/2024    2:56 PM  Fall Risk  Falls in the past year? 0 0 0 0 1  Was there an injury with Fall? 0 0 0 0 0  Fall Risk Category Calculator 0 0 0 0 2  Fall Risk Category (Retired) Low       (RETIRED) Patient Fall Risk Level Low fall risk       Patient at Risk for Falls Due to No Fall Risks No Fall Risks No Fall Risks No Fall Risks Impaired balance/gait  Fall risk Follow up Falls evaluation completed  Falls evaluation completed Falls evaluation completed Falls evaluation completed Falls evaluation completed     Data saved with a previous flowsheet row definition   Functional Status Survey:    Vitals:   05/15/24 1459  BP: 132/80  Pulse: 70  Resp: 20  Temp: 97.6 F (36.4 C)  SpO2: 97%  Weight: 211 lb 12.8 oz (96.1 kg)  Height: 5' 3 (1.6 m)   Body mass index is 37.52 kg/m. Physical Exam  GENERAL: Alert, cooperative, well developed, no acute distress HEENT: Normocephalic, normal oropharynx, moist mucous membranes CHEST: Clear to auscultation bilaterally, no wheezes, rhonchi, or crackles CARDIOVASCULAR: Normal heart rate and rhythm, S1 and S2 normal without murmurs ABDOMEN: Soft, non-tender, non-distended, without organomegaly, normal bowel sounds EXTREMITIES: No cyanosis or edema MUSCULOSKELETAL: No tenderness along the spine NEUROLOGICAL: Cranial nerves grossly  intact, moves all extremities without gross motor or sensory deficit  SKIN: No rash,no lesion or erythema   PSYCHIATRY/BEHAVIORAL: Mood stable    Labs reviewed: Recent Labs    12/23/23 1017  NA 143  K 4.4  CL 105  CO2 30  GLUCOSE 88  BUN 11  CREATININE 0.87  CALCIUM 9.7   Recent Labs    12/23/23 1017  AST 17  ALT 16  BILITOT 0.5  PROT 6.7   Recent Labs    12/23/23 1017  WBC 3.4*  NEUTROABS 1,761  HGB 11.3*  HCT 35.4  MCV 88.9  PLT 216   Lab Results  Component Value Date   TSH 1.25 12/23/2023   No results found for: HGBA1C Lab Results  Component Value Date   CHOL 220 (H) 12/23/2023   HDL 96 12/23/2023   LDLCALC 109 (H) 12/23/2023   TRIG 68 12/23/2023   CHOLHDL 2.3 12/23/2023    Significant Diagnostic Results in last 30 days:  No results found.  Assessment/Plan  Memory Loss Concerns about memory and recall, including difficulty remembering tasks and forming sentences, have been present  for less than a year and are becoming more concerning. Family history of dementia and Alzheimer's in her mother, diagnosed in her late 60. Discussed potential impact of diet and vitamin deficiencies on memory. No cure for memory loss, but some medications may help prevent worsening. Emphasized importance of a balanced diet, avoiding meal skipping, and maintaining a healthy lifestyle to support memory function. - Perform memory screening test - Order MRI of the brain if indicated - Check vitamin B12 and folate levels - Consider referral to a neurologist based on test results - Advise on maintaining a balanced diet and avoiding meal skipping  Back Pain Mid-back pain occurs when bumping into objects or being lightly pushed, present for six to nine months, described along the spine. No associated neck pain or headaches. - Order x-ray of the spine at [facility]  Hyperlipidemia Previous lab results showed elevated total cholesterol at 220 mg/dL and LDL  cholesterol at 109 mg/dL. Emphasized need to lower these levels to reduce cardiovascular risk through dietary modifications. - Advise on dietary modifications to lower cholesterol  Anemia Slightly anemic with a hemoglobin level of 11.3 g/dL. Emphasized importance of a balanced diet to improve hemoglobin levels and suggested increasing intake of fruits and vegetables. - Advise on dietary modifications to increase iron intake  Leukopenia White blood cell count is slightly low at 3.4 x 10^9/L. Suggested improving dietary intake, particularly of vitamin C, to address this issue. - Advise on dietary modifications to increase vitamin C intake    Family/ staff Communication: Reviewed plan of care with patient verbalized understanding   Labs/tests ordered:  - MRI Brain  - Dg thoracic spine  - Folate - Vit B 12 level    Next Appointment: Return if symptoms worsen or fail to improve.   Total time: 30 minutes. Greater than 50% of total time spent doing patient education regarding memory loss, Anemia,Leukopenia ,HLD,chronic back pain,health maintenance including symptom/medication management.   Roxan JAYSON Plough, NP

## 2024-06-02 ENCOUNTER — Encounter: Payer: Self-pay | Admitting: Family

## 2024-06-04 ENCOUNTER — Ambulatory Visit
Admission: RE | Admit: 2024-06-04 | Discharge: 2024-06-04 | Disposition: A | Discharge status: Home / Self Care | Source: Ambulatory Visit | Attending: Family | Admitting: Family

## 2024-06-04 DIAGNOSIS — R413 Other amnesia: Secondary | ICD-10-CM

## 2024-06-04 DIAGNOSIS — R519 Headache, unspecified: Secondary | ICD-10-CM | POA: Diagnosis not present

## 2024-06-04 MED ORDER — GADOPICLENOL 0.5 MMOL/ML IV SOLN
10.0000 mL | Freq: Once | INTRAVENOUS | Status: AC | PRN
  Administered 2024-06-04: 10 mL via INTRAVENOUS

## 2024-06-13 ENCOUNTER — Encounter: Payer: Self-pay | Admitting: Family

## 2024-06-13 ENCOUNTER — Ambulatory Visit (INDEPENDENT_AMBULATORY_CARE_PROVIDER_SITE_OTHER): Admitting: Family

## 2024-06-13 ENCOUNTER — Telehealth: Payer: Self-pay

## 2024-06-13 VITALS — BP 134/80 | HR 84 | Temp 97.8°F | Resp 20 | Ht 63.0 in | Wt 215.0 lb

## 2024-06-13 DIAGNOSIS — E785 Hyperlipidemia, unspecified: Secondary | ICD-10-CM

## 2024-06-13 DIAGNOSIS — F331 Major depressive disorder, recurrent, moderate: Secondary | ICD-10-CM

## 2024-06-13 DIAGNOSIS — I739 Peripheral vascular disease, unspecified: Secondary | ICD-10-CM

## 2024-06-13 DIAGNOSIS — K119 Disease of salivary gland, unspecified: Secondary | ICD-10-CM | POA: Diagnosis not present

## 2024-06-13 DIAGNOSIS — R413 Other amnesia: Secondary | ICD-10-CM

## 2024-06-13 MED ORDER — BUPROPION HCL ER (XL) 150 MG PO TB24: 150.0000 mg | ORAL_TABLET | Freq: Every day | ORAL | 1 refills | Status: DC

## 2024-06-13 MED ORDER — ATORVASTATIN CALCIUM 10 MG PO TABS: 10.0000 mg | ORAL_TABLET | Freq: Every day | ORAL | 3 refills | Status: DC

## 2024-06-13 NOTE — Telephone Encounter (Signed)
 Cheryl with [facility] called to bring impression # [phone] to Dinah's attention on the recent MR Brain W Wo Contrast

## 2024-06-13 NOTE — Telephone Encounter (Signed)
 Left message on voicemail for patient to return call when available . Reason for call: Offer video or in person visit to discuss results

## 2024-06-13 NOTE — Telephone Encounter (Signed)
 Please schedule office appointment to discuss MRI results.

## 2024-06-13 NOTE — Telephone Encounter (Signed)
Patient returned call and appointment has been scheduled

## 2024-06-14 NOTE — Telephone Encounter (Signed)
 Noted

## 2024-06-20 NOTE — Progress Notes (Signed)
 Provider: Roxan Plough FNP-C  Emiliano Welshans, Roxan BROCKS, NP  Patient Care Team: Harce Volden, Roxan BROCKS, NP as PCP - General (Family Medicine)  Extended Emergency Contact Information Primary Emergency Contact: [facility] Address: [address]          [place], [place] [address] [place] Home Phone: [phone] Relation: Daughter  Code Status: Full Code  Goals of care: Advanced Directive information    05/15/2024    2:59 PM  Advanced Directives  Does Patient Have a Medical Advance Directive? No  Would patient like information on creating a medical advance directive? No - Patient declined     Chief Complaint  Patient presents with   Results    Discuss MRI results    Discussed the use of AI scribe software for clinical note transcription with the patient, who gave verbal consent to proceed.  History of Present Illness   Tynlee Bayle is a 63 year old female who presents with memory loss and a lesion on the left parotid gland.  She underwent an MRI due to concerns about memory loss, which showed mild chronic small vessel ischemia changes within the cerebral white matter but no acute intracranial abnormalities or significant cerebral atrophy. There is a family history of memory loss or dementia in her mother.  The MRI also revealed a 9 mm lesion on the left parotid gland, potentially a cyst or a primary parotid neoplasm. She has no history of swelling or issues on the left side of her face and reports no symptoms such as swelling in the area. Also has no headaches, dizziness or nausea or vomiting.  She is currently taking Wellbutrin  (bupropion ) for depression and wishes to adjust her dosage back to 150 mg for appetite control. She has not reported any issues with the current dosage.  Her last cholesterol check five months ago showed an LDL level of 220. She has not been consistent with exercise but intends to increase her physical activity. No history of smoking.      Past Medical History:  Diagnosis Date   Depression    History of colonoscopy 2010   [place]   Past Surgical History:  Procedure Laterality Date   CESAREAN SECTION     COLONOSCOPY      No Known Allergies  Outpatient Encounter Medications as of 06/13/2024  Medication Sig   atorvastatin  (LIPITOR) 10 MG tablet Take 1 tablet (10 mg total) by mouth daily.   hydrOXYzine  (ATARAX ) 10 MG tablet Take 1 tablet (10 mg total) by mouth at bedtime as needed.   Vitamin D , Cholecalciferol , 25 MCG (1000 UT) TABS Take 5,000 Units by mouth once a week.   [DISCONTINUED] buPROPion  (WELLBUTRIN  XL) 300 MG 24 hr tablet TAKE 1 TABLET(300 MG) BY MOUTH DAILY   buPROPion  (WELLBUTRIN  XL) 150 MG 24 hr tablet Take 1 tablet (150 mg total) by mouth daily.   Facility-Administered Encounter Medications as of 06/13/2024  Medication   0.9 %  sodium chloride  infusion    Review of Systems  Constitutional:  Negative for appetite change, chills, fatigue, fever and unexpected weight change.  HENT:  Negative for congestion, dental problem, ear discharge, ear pain, facial swelling, hearing loss, nosebleeds, postnasal drip, rhinorrhea, sinus pressure, sinus pain, sneezing, sore throat, tinnitus and trouble swallowing.   Eyes:  Negative for pain, discharge, redness, itching and visual disturbance.  Respiratory:  Negative for cough, chest tightness, shortness of breath and wheezing.   Cardiovascular:  Negative for chest pain, palpitations and leg swelling.  Gastrointestinal:  Negative for abdominal distention, abdominal pain, blood in stool, constipation, diarrhea, nausea and vomiting.  Endocrine: Negative for cold intolerance, heat intolerance, polydipsia, polyphagia and polyuria.  Genitourinary:  Negative for difficulty urinating, dysuria, flank pain, frequency and urgency.  Musculoskeletal:  Negative for arthralgias, back pain, gait problem, joint swelling, myalgias, neck pain and neck stiffness.  Skin:  Negative for  color change, pallor, rash and wound.  Neurological:  Negative for dizziness, syncope, speech difficulty, weakness, light-headedness, numbness and headaches.  Hematological:  Does not bruise/bleed easily.  Psychiatric/Behavioral:  Negative for agitation, behavioral problems, confusion, hallucinations, self-injury, sleep disturbance and suicidal ideas. The patient is not nervous/anxious.        Request Wellbutrin  dose to be reduced  Memory loss     Immunization History  Administered Date(s) Administered   Moderna Sars-Covid-2 Vaccination 01/08/2020, 02/05/2020   Td 10/26/1990, 11/11/2007, 03/29/2018   Pertinent  Health Maintenance Due  Topic Date Due   INFLUENZA VACCINE  01/23/2025 (Originally 05/26/2024)   MAMMOGRAM  01/05/2026   Colonoscopy  03/25/2028      10/22/2022   10:37 AM 01/08/2023    8:55 AM 12/23/2023    9:37 AM 01/12/2024    3:36 PM 05/15/2024    2:56 PM  Fall Risk  Falls in the past year? 0 0 0 0 1  Was there an injury with Fall? 0 0 0 0 0  Fall Risk Category Calculator 0 0 0 0 2  Fall Risk Category (Retired) Low       (RETIRED) Patient Fall Risk Level Low fall risk       Patient at Risk for Falls Due to No Fall Risks No Fall Risks No Fall Risks No Fall Risks Impaired balance/gait  Fall risk Follow up Falls evaluation completed  Falls evaluation completed Falls evaluation completed Falls evaluation completed Falls evaluation completed     Data saved with a previous flowsheet row definition   Functional Status Survey:    Vitals:   06/13/24 1451  BP: 134/80  Pulse: 84  Resp: 20  Temp: 97.8 F (36.6 C)  SpO2: 93%  Weight: 215 lb (97.5 kg)  Height: 5' 3 (1.6 m)   Body mass index is 38.09 kg/m. Physical Exam  VITALS: T- 97.8, P- 84, BP- 134/80, SaO2- 93% MEASUREMENTS: Weight- 215. GENERAL: Alert, cooperative, well developed, no acute distress HEENT: Normocephalic, normal oropharynx, moist mucous membranes NECK: No cervical lymphadenopathy, Neck  non-tender CHEST: Clear to auscultation bilaterally, No wheezes, rhonchi, or crackles CARDIOVASCULAR: Normal heart rate and rhythm, S1 and S2 normal without murmurs ABDOMEN: Soft, non-tender, non-distended, without organomegaly, Normal bowel sounds EXTREMITIES: No cyanosis or edema NEUROLOGICAL: Cranial nerves grossly intact, Moves all extremities without gross motor or sensory deficit  SKIN: No rash,no lesion or erythema   PSYCHIATRY/BEHAVIORAL: Mood stable    Labs reviewed: Recent Labs    12/23/23 1017  NA 143  K 4.4  CL 105  CO2 30  GLUCOSE 88  BUN 11  CREATININE 0.87  CALCIUM  9.7   Recent Labs    12/23/23 1017  AST 17  ALT 16  BILITOT 0.5  PROT 6.7   Recent Labs    12/23/23 1017  WBC 3.4*  NEUTROABS 1,761  HGB 11.3*  HCT 35.4  MCV 88.9  PLT 216   Lab Results  Component Value Date   TSH 1.25 12/23/2023   No results found for: HGBA1C Lab Results  Component Value Date   CHOL 220 (H) 12/23/2023   HDL  96 12/23/2023   LDLCALC 109 (H) 12/23/2023   TRIG 68 12/23/2023   CHOLHDL 2.3 12/23/2023    Significant Diagnostic Results in last 30 days:  MR Brain W Wo Contrast Result Date: 06/13/2024 CLINICAL DATA:  Provided history: Memory loss. Additional history provided by the scanning technologist: Headaches. EXAM: MRI HEAD WITHOUT AND WITH CONTRAST TECHNIQUE: Multiplanar, multiecho pulse sequences of the brain and surrounding structures were obtained without and with intravenous contrast. CONTRAST:  10 mL Vueway  intravenous contrast. COMPARISON:  None. FINDINGS: Brain: No age-advanced or lobar predominant cerebral atrophy. Multifocal T2 FLAIR hyperintense signal abnormality within the cerebral white matter, nonspecific but compatible with mild chronic small vessel ischemic disease. No cortical encephalomalacia is identified. There is no acute infarct. No evidence of an intracranial mass. No chronic intracranial blood products. No extra-axial fluid collection. No  midline shift. No pathologic intracranial enhancement identified. Vascular: Maintained flow voids within the proximal large arterial vessels. Skull and upper cervical spine: No focal worrisome marrow lesion. Sinuses/Orbits: No mass or acute finding within the imaged orbits. No significant paranasal sinus disease. Other: 9 mm ovoid T2 hyperintense lesion within the left parotid gland inferiorly, which could reflect a cyst or primary parotid neoplasm (series 114, image 18). Impression #4 will be called to the ordering clinician or representative by the Radiologist Assistant, and communication documented in the PACS or [employer]. IMPRESSION: 1.  No evidence of an acute intracranial abnormality. 2.  No age-advanced or lobar predominant cerebral atrophy. 3. Mild chronic small vessel ischemic changes within the cerebral white matter. 4. 9 mm left parotid gland lesion, which could reflect a cyst or a primary parotid neoplasm. Consider ENT referral. Electronically Signed   By: Rockey Childs D.O.   On: 06/13/2024 08:22    Assessment/Plan    Left parotid gland lesion (cyst vs neoplasm, under evaluation) A 9 mm lesion on the left parotid gland, potentially a cyst or primary parotid neoplasm, with no symptoms such as swelling or tenderness. - Refer to ENT specialist for further evaluation. - Consider biopsy if recommended by ENT.  Memory loss MRI showed no acute intracranial abnormalities. Family history of dementia present. - Refer to neurologist for further evaluation.  Cerebral small vessel ischemic changes MRI indicates mild, chronic small vessel ischemic changes in the cerebral white matter, often related to dietary factors and associated with increased stroke risk. - Advise dietary modifications to reduce risk factors. - Encourage regular exercise to improve cardiovascular health.  Hyperlipidemia Elevated cholesterol levels with LDL of 220. Dietary and lifestyle modifications recommended to  manage cholesterol and reduce cardiovascular risk. - Start cholesterol medication and send prescription to pharmacy. - Recheck cholesterol levels in 4 months. - Monitor liver function in 1 month after starting medication. - Advise dietary changes to include lean meats and plant-based proteins. - Encourage regular exercise, at least three times a week.  Depression Currently managed with Wellbutrin . She requested dosage adjustment to 150 mg for appetite control. - Adjust Wellbutrin  dosage to 150 mg and send prescription to pharmacy. - Monitor for any worsening of depression symptoms with dosage change.   Family/ staff Communication: Reviewed plan of care with patient verbalized understanding   Labs/tests ordered:  Hepatic panel  Lipid panel   Next Appointment: Return in about 4 months (around 10/13/2024) for Fasting Lipid panel . One month for Hepatic panel .   Total time: 20 minutes. Greater than 50% of total time spent doing patient education regarding MRI results,HLD,Depression ,health maintenance including symptom/medication management.  Roxan JAYSON Plough, NP

## 2024-06-29 DIAGNOSIS — F330 Major depressive disorder, recurrent, mild: Secondary | ICD-10-CM | POA: Diagnosis not present

## 2024-07-14 ENCOUNTER — Other Ambulatory Visit

## 2024-07-14 DIAGNOSIS — E785 Hyperlipidemia, unspecified: Secondary | ICD-10-CM

## 2024-07-20 ENCOUNTER — Encounter: Payer: Self-pay | Admitting: Physician Assistant

## 2024-07-20 DIAGNOSIS — F330 Major depressive disorder, recurrent, mild: Secondary | ICD-10-CM | POA: Diagnosis not present

## 2024-07-25 DIAGNOSIS — F330 Major depressive disorder, recurrent, mild: Secondary | ICD-10-CM | POA: Diagnosis not present

## 2024-08-16 ENCOUNTER — Other Ambulatory Visit: Payer: Self-pay | Admitting: Medical Genetics

## 2024-08-16 DIAGNOSIS — M9903 Segmental and somatic dysfunction of lumbar region: Secondary | ICD-10-CM | POA: Diagnosis not present

## 2024-08-16 DIAGNOSIS — M9904 Segmental and somatic dysfunction of sacral region: Secondary | ICD-10-CM | POA: Diagnosis not present

## 2024-08-16 DIAGNOSIS — M9905 Segmental and somatic dysfunction of pelvic region: Secondary | ICD-10-CM | POA: Diagnosis not present

## 2024-08-16 DIAGNOSIS — M51362 Other intervertebral disc degeneration, lumbar region with discogenic back pain and lower extremity pain: Secondary | ICD-10-CM | POA: Diagnosis not present

## 2024-08-18 ENCOUNTER — Ambulatory Visit (INDEPENDENT_AMBULATORY_CARE_PROVIDER_SITE_OTHER): Admitting: Family

## 2024-08-18 ENCOUNTER — Encounter: Payer: Self-pay | Admitting: Family

## 2024-08-18 VITALS — BP 130/82 | HR 80 | Temp 98.1°F | Ht 63.0 in | Wt 217.0 lb

## 2024-08-18 DIAGNOSIS — Z6838 Body mass index (BMI) 38.0-38.9, adult: Secondary | ICD-10-CM

## 2024-08-18 DIAGNOSIS — F331 Major depressive disorder, recurrent, moderate: Secondary | ICD-10-CM

## 2024-08-18 DIAGNOSIS — E785 Hyperlipidemia, unspecified: Secondary | ICD-10-CM

## 2024-08-18 NOTE — Progress Notes (Signed)
 Provider: Roxan Plough FNP-C  Jawaun Celmer, Roxan BROCKS, NP  Patient Care Team: Ikaika Showers, Roxan BROCKS, NP as PCP - General (Family Medicine)  Extended Emergency Contact Information Primary Emergency Contact: [facility] Address: [address]          [place], [place] [address] [place] Home Phone: [phone] Relation: Daughter  Code Status:  Full Code  Goals of care: Advanced Directive information    05/15/2024    2:59 PM  Advanced Directives  Does Patient Have a Medical Advance Directive? No  Would patient like information on creating a medical advance directive? No - Patient declined     Chief Complaint  Patient presents with   Weight Loss    Discussed the use of AI scribe software for clinical note transcription with the patient, who gave verbal consent to proceed.  History of Present Illness   Susan [name] is a 63 year old female who presents with difficulty losing weight despite lifestyle changes.  She has been unable to lose weight despite exercising three days a week, primarily walking at least a mile each time, and modifying her diet to include more vegetables, following a Mediterranean diet. She has reduced her intake of pork and beef, focusing on chicken, but acknowledges the need to further cut down on butter and bread. Her weight has increased from 215 pounds to 217 pounds since her last visit.  She is not currently taking atorvastatin  for cholesterol management due to concerns about the medication. Her recent cholesterol levels show a total cholesterol of 220 mg/dL and an LDL of 890 mg/dL, which have increased from previous levels of 200 mg/dL and 899 mg/dL, respectively. Her triglycerides are well-controlled at 68 mg/dL. She uses olive oil instead of other oils.  She has a history of depression and notes a lack of motivation to engage in activities outside of walking. She attributes some of her stress to recent lifestyle changes, including purchasing  a house last November, and external factors such as job insecurity and political concerns. She reports high stress levels, which she believes may be impacting her weight.  Her legs remain swollen, which she attributes to fluid retention. Her bowel movements are regular, aided by a diet rich in fiber, including carrots and cucumbers. She drinks plenty of water and has been mindful of her vitamin D  levels, which are reportedly good.    Past Medical History:  Diagnosis Date   Depression    History of colonoscopy 2010   [place]   Past Surgical History:  Procedure Laterality Date   CESAREAN SECTION     COLONOSCOPY      No Known Allergies  Outpatient Encounter Medications as of 08/18/2024  Medication Sig   buPROPion  (WELLBUTRIN  XL) 150 MG 24 hr tablet Take 1 tablet (150 mg total) by mouth daily.   hydrOXYzine  (ATARAX ) 10 MG tablet Take 1 tablet (10 mg total) by mouth at bedtime as needed.   Vitamin D , Cholecalciferol , 25 MCG (1000 UT) TABS Take 5,000 Units by mouth once a week.   [DISCONTINUED] atorvastatin  (LIPITOR) 10 MG tablet Take 1 tablet (10 mg total) by mouth daily. (Patient not taking: Reported on 08/18/2024)   Facility-Administered Encounter Medications as of 08/18/2024  Medication   0.9 %  sodium chloride  infusion    Review of Systems  Constitutional:  Negative for appetite change, chills, fatigue, fever and unexpected weight change.  HENT:  Negative for congestion, ear discharge, ear pain, hearing loss, nosebleeds, postnasal drip, rhinorrhea, sinus  pressure, sinus pain, sneezing and sore throat.   Eyes:  Negative for pain, discharge, redness, itching and visual disturbance.  Respiratory:  Negative for cough, chest tightness, shortness of breath and wheezing.   Cardiovascular:  Negative for chest pain, palpitations and leg swelling.  Gastrointestinal:  Negative for abdominal distention, abdominal pain, constipation, diarrhea, nausea and vomiting.  Endocrine: Negative for  cold intolerance, heat intolerance, polydipsia, polyphagia and polyuria.  Genitourinary:  Negative for difficulty urinating, dysuria, flank pain, frequency and urgency.  Musculoskeletal:  Negative for arthralgias, back pain, gait problem, joint swelling and myalgias.  Skin:  Negative for color change, pallor and rash.  Neurological:  Negative for dizziness, syncope, speech difficulty, weakness, light-headedness, numbness and headaches.  Hematological:  Does not bruise/bleed easily.  Psychiatric/Behavioral:  Negative for agitation, behavioral problems, confusion, hallucinations and sleep disturbance. The patient is not nervous/anxious.        Stressed    Immunization History  Administered Date(s) Administered   Moderna Sars-Covid-2 Vaccination 01/08/2020, 02/05/2020   Td 10/26/1990, 11/11/2007, 03/29/2018   Pertinent  Health Maintenance Due  Topic Date Due   Influenza Vaccine  01/23/2025 (Originally 05/26/2024)   Mammogram  01/05/2026   Colonoscopy  03/25/2028      10/22/2022   10:37 AM 01/08/2023    8:55 AM 12/23/2023    9:37 AM 01/12/2024    3:36 PM 05/15/2024    2:56 PM  Fall Risk  Falls in the past year? 0 0 0 0 1  Was there an injury with Fall? 0 0 0 0 0  Fall Risk Category Calculator 0 0 0 0 2  Fall Risk Category (Retired) Low       (RETIRED) Patient Fall Risk Level Low fall risk       Patient at Risk for Falls Due to No Fall Risks No Fall Risks No Fall Risks No Fall Risks Impaired balance/gait  Fall risk Follow up Falls evaluation completed  Falls evaluation completed Falls evaluation completed Falls evaluation completed Falls evaluation completed     Data saved with a previous flowsheet row definition   Functional Status Survey:    Vitals:   08/18/24 1127  BP: 130/82  Pulse: 80  Temp: 98.1 F (36.7 C)  SpO2: 98%  Weight: 217 lb (98.4 kg)  Height: 5' 3 (1.6 m)   Body mass index is 38.44 kg/m. Physical Exam  MEASUREMENTS: Weight- 217. GENERAL: Alert,  cooperative, well developed, no acute distress. HEENT: Normocephalic, normal oropharynx, moist mucous membranes. CHEST: Clear to auscultation bilaterally, no wheezes, rhonchi, or crackles. CARDIOVASCULAR: Normal heart rate and rhythm, S1 and S2 normal without murmurs. ABDOMEN: Soft, non-tender, non-distended, without organomegaly, normal bowel sounds. EXTREMITIES: No cyanosis or edema. NEUROLOGICAL: Cranial nerves grossly intact, moves all extremities without gross motor or sensory deficit.  SKIN: No rash,no lesion or erythema   PSYCHIATRY/BEHAVIORAL: Mood stable    Labs reviewed: Recent Labs    12/23/23 1017  NA 143  K 4.4  CL 105  CO2 30  GLUCOSE 88  BUN 11  CREATININE 0.87  CALCIUM  9.7   Recent Labs    12/23/23 1017  AST 17  ALT 16  BILITOT 0.5  PROT 6.7   Recent Labs    12/23/23 1017  WBC 3.4*  NEUTROABS 1,761  HGB 11.3*  HCT 35.4  MCV 88.9  PLT 216   Lab Results  Component Value Date   TSH 1.25 12/23/2023   No results found for: HGBA1C Lab Results  Component Value Date  CHOL 220 (H) 12/23/2023   HDL 96 12/23/2023   LDLCALC 109 (H) 12/23/2023   TRIG 68 12/23/2023   CHOLHDL 2.3 12/23/2023    Significant Diagnostic Results in last 30 days:  No results found.  Assessment/Plan  Obesity BMI 38.44 with associated comorbidity hyperlipidemia. Persistent obesity despite dietary changes and exercise. Current weight is 217 lbs, up from 215 lbs. Following a Mediterranean diet and walking a mile three  Stress and lifestyle changes may contribute. - Refer to weight management clinic for evaluation and potential treatment options, including phentermine. - Discuss potential pharmacological interventions for weight loss, including Mounjaro, Ozempic, and Zepbound, typically covered by insurance with comorbid conditions like diabetes or heart disease. - Continue current exercise regimen and dietary modifications. - Monitor stress levels and find relaxation  techniques.  Hyperlipidemia Hyperlipidemia with total cholesterol at 220 mg/dL and LDL at 890 mg/dL, increased from previous levels. Triglycerides are well-controlled at 68 mg/dL. Not taking atorvastatin  due to concerns about the medication. - Encourage dietary modifications, particularly reducing butter intake. - Consider re-evaluation of lipid levels with fasting cholesterol test.  Depression Depression with low motivation and high stress levels. Engages in walking but reports limited motivation for other activities. Stress related to work and [employer] may contribute. - Encourage continued physical activity and exploration of stress-reduction techniques such as deep breathing exercises.  Edema of lower extremities Reports of leg swelling, possibly related to fluid retention. No significant fluid retention observed during examination.   Family/ staff Communication: Reviewed plan of care with patient verbalized understanding  Labs/tests ordered: None   Next Appointment: Return if symptoms worsen or fail to improve.   Total time: 20 minutes. Greater than 50% of total time spent doing patient education regarding Obesity,Depression HLD and health maintenance including symptom/medication management.   Roxan JAYSON Plough, NP

## 2024-08-19 ENCOUNTER — Other Ambulatory Visit: Payer: Self-pay

## 2024-08-22 ENCOUNTER — Encounter (INDEPENDENT_AMBULATORY_CARE_PROVIDER_SITE_OTHER): Payer: Self-pay

## 2024-08-22 ENCOUNTER — Encounter: Admitting: Physician Assistant

## 2024-08-22 ENCOUNTER — Ambulatory Visit (INDEPENDENT_AMBULATORY_CARE_PROVIDER_SITE_OTHER)

## 2024-08-22 ENCOUNTER — Other Ambulatory Visit
Admission: RE | Admit: 2024-08-22 | Discharge: 2024-08-22 | Disposition: A | Discharge status: Home / Self Care | Payer: Self-pay | Source: Ambulatory Visit | Attending: Medical Genetics | Admitting: Medical Genetics

## 2024-08-22 VITALS — BP 159/87 | HR 73 | Temp 98.0°F | Wt 215.0 lb

## 2024-08-22 DIAGNOSIS — H6123 Impacted cerumen, bilateral: Secondary | ICD-10-CM

## 2024-08-22 DIAGNOSIS — K119 Disease of salivary gland, unspecified: Secondary | ICD-10-CM

## 2024-08-22 NOTE — Progress Notes (Signed)
 Erroneous encounter. Disregard.

## 2024-08-22 NOTE — Patient Instructions (Signed)
 Cerumen Impactions  Earwax, or cerumen,is made by the glands and the skin of the ear canal. If it is made in excess or very dry, a blockage or impaction may result. This is also common with hearing aids or frequent use of earbuds. Do not use cotton swabs a.k.a. Q-tips. Instead try the following: turn your head to one side and gently fill your canal with baby oil or mineral oil, using an eyedropper. Allow the oil to soak in for a minute or two before turning over and placing the oil in the opposite ear. Do this once or twice a day for 3 to 4 days. This will allow the wax to soften. For the next three or four days gently filled the ear canal with 3% hydrogen peroxide in the same manner that you installed the oil. Hydrogen peroxide is available at your pharmacy or market and will usually bubble out the air wax once it has become soft. If you have ventilating tube to your ears, dilute peroxide and half with water; Discontinue if you have any discomfort, dizziness or drainage. For stubborn ear impaction, it may be necessary to continue the oil and peroxide for a few weeks or have it removed by your doctor. People are frequently troubled by wax and actions may want to use the oil and peroxide on a weekly or monthly basis. If you have any further questions, do not hesitate to ask.  ? Do not use Q-tips ? Once a week use a dropper to apply 2 to 3 jobs in mineral oil to both ear canals at bedtime.

## 2024-08-22 NOTE — Progress Notes (Signed)
 Dear Dr. Leonarda, Here is my assessment for our mutual patient, Susan [name]. Thank you for allowing me the opportunity to care for your patient. Please do not hesitate to contact me should you have any other questions. Sincerely, Dr. Penne Croak  Otolaryngology Clinic Note Referring provider: Dr. Leonarda HPI:  Discussed the use of AI scribe software for clinical note transcription with the patient, who gave verbal consent to proceed.  History of Present Illness Susan [name] is a 63 year old female who presents with a bump on her left cheek discovered during an MRI for memory loss.  Left cheek mass - Small bump on left cheek discovered incidentally on MRI performed for memory loss - Lesion measures approximately 8 millimeters in size - No associated facial tingling or other neurological symptoms  Cerumen impaction - Chronic earwax buildup, predominantly in the right ear - Manages with Q-tips and ear drops - History of prior earwax removal by a physician  Lingual hyperpigmentation - Black spots on tongue present for several years - Appearance of spots fluctuates over time  Independent Review of Additional Tests or Records:  Reviewed external note from referring PCP, Ngetich,describing RElevant history incorporated into today's evaluation. I personally reviewed and interpreted MR brain w/ wo - demonstrating t2 hyperintense lesion within left parotid that is about 8mm in diameter.   PMH/Meds/All/SocHx/FamHx/ROS:   Past Medical History:  Diagnosis Date   Depression    History of colonoscopy 2010   [place]     Past Surgical History:  Procedure Laterality Date   CESAREAN SECTION     COLONOSCOPY      Family History  Problem Relation Age of Onset   Colon polyps Mother    Hyperlipidemia Mother    Heart disease Mother    Heart attack Mother    Heart attack Father    Stroke Father    Hypertension Father    Colon polyps Sister    Hypertension Sister     Hypertension Sister    Cancer Brother    Alcohol abuse Brother    HIV Brother    Colon cancer Neg Hx    Esophageal cancer Neg Hx    Rectal cancer Neg Hx    Stomach cancer Neg Hx      Social Connections: Moderately Isolated (01/12/2024)   Social Connection and Isolation Panel    Frequency of Communication with Friends and Family: Twice a week    Frequency of Social Gatherings with Friends and Family: Once a week    Attends Religious Services: 1 to 4 times per year    Active Member of [employer] or Organizations: No    Attends Engineer, Structural: Not on file    Marital Status: Divorced      Current Outpatient Medications:    buPROPion  (WELLBUTRIN  XL) 150 MG 24 hr tablet, Take 1 tablet (150 mg total) by mouth daily., Disp: 90 tablet, Rfl: 1   Vitamin D , Cholecalciferol , 25 MCG (1000 UT) TABS, Take 5,000 Units by mouth once a week., Disp: 60 tablet, Rfl: 3   hydrOXYzine  (ATARAX ) 10 MG tablet, Take 1 tablet (10 mg total) by mouth at bedtime as needed. (Patient not taking: Reported on 08/22/2024), Disp: 30 tablet, Rfl: 0  Current Facility-Administered Medications:    0.9 %  sodium chloride  infusion, 500 mL, Intravenous, Once, [name], MD   Physical Exam:   BP (!) 159/87 (BP Location: Left Arm, Patient Position: Sitting, Cuff Size: Normal) Comment: pt had a ruff drive  in the rain  Pulse 73   Temp 98 F (36.7 C)   Wt 215 lb (97.5 kg)   SpO2 97%   BMI 38.09 kg/m   The patient was awake, alert, and appropriate. The external ears were inspected, and otoscopy was performed to evaluate the external auditory canals and tympanic membranes. The nasal cavity and septum were examined for mucosal changes, obstruction, or discharge. The oral cavity and oropharynx were inspected for mucosal lesions, infection, or tonsillar hypertrophy. The neck was palpated for lymphadenopathy, thyroid  abnormalities, or other masses. Cranial nerve function was grossly intact.  Pertinent  Findings: Physical Exam HEENT: Eardrums normal, nose normal, oral cavity normal, fullness left cheek but no firm nodule or mass. Cerumen impaction   Seprately Identifiable Procedures:  I personally ordered, reviewed and interpreted the following with the patient today  Procedure: Bilateral ear microscopy and cerumen removal using microscope (CPT [phone]) - Mod 25 Pre-procedure diagnosis: Cerumen impaction external ear/ears Post-procedure diagnosis: same Indication:  cerumen impaction; given patient's otologic complaints and history as well as for improved and comprehensive examination of external ear and tympanic membrane, bilateral otologic examination using microscope was performed and impacted cerumen removed  Procedure: Patient was placed semi-recumbent. Both ear canals were examined using the microscope with findings above. Cerumen removed on left and on right using suction and currette with improvement in EAC examination and patency. Left: EAC was patent. TM was intact . Middle ear was aerated. Drainage: absent Right: EAC was patent. TM was intact . Middle ear was aerated . Drainage: absent Patient tolerated the procedure well.  Impression & Plans:  Susan [name] is a 63 y.o. female  1. Lesion of parotid gland   2. Bilateral impacted cerumen     - Findings and diagnoses discussed in detail with the patient. - Risks, benefits, and alternatives were reviewed. Through shared decision making, the patient elects to proceed with below. Assessment & Plan Left cheek cystic lesion Small 8 mm cystic lesion on left cheek, likely benign. Differential includes reactive lymph node or salivary gland cyst. Further evaluation needed to exclude concerning pathology. - Order ultrasound-guided fine needle aspiration biopsy of the left cheek lesion. - Instruct her on the procedure, including numbing medicine and potential minor scarring.  Cerumen impaction, right ear Cerumen impaction in right ear  causing blockage. Suction used for removal. - Provide instructions for at-home cerumen management. - Advise her to contact the office if initial instructions are ineffective.  - Orders placed:  Orders Placed This Encounter  Procedures   US  FNA BIOPSY SALIVARY GLAND PAROTID GLAND EA ADDT'L LESION   - Medications prescribed/continued/adjusted: No orders of the defined types were placed in this encounter.  - Education materials provided to the patient. - Follow up: No need for phone call if FNA is unremarkable. Follow up PRN. Patient instructed to return sooner or go to the ED if new/worsening symptoms develop.   Thank you for allowing me the opportunity to care for your patient. Please do not hesitate to contact me should you have any other questions.  Sincerely, Penne Croak, DO Otolaryngologist (ENT) [facility] Phone: [phone] Fax: [phone]  08/22/2024, 8:30 PM

## 2024-08-23 ENCOUNTER — Ambulatory Visit: Admitting: Family

## 2024-08-23 ENCOUNTER — Encounter (HOSPITAL_COMMUNITY): Payer: Self-pay

## 2024-08-23 NOTE — Progress Notes (Signed)
 Luverne Aran, MD  Michaelene Setter OK for FNA of left parotid gland lesion under US  guidance: local only. May be difficult to see.  GY       Previous Messages    ----- Message ----- From: Dany Walther Sent: 08/22/2024   2:38 PM EDT To: Karrin Eisenmenger; Ir Procedure Requests Subject: US  FNA BIOPSY SALIVARY GLAND PAROTID GLAND E*   Provider : US  FNA BIOPSY SALIVARY GLAND PAROTID GLAND EA ADDT'L LESION  Reason : parotid mass Dx: Lesion of parotid gland [K11.9 (ICD-10-CM)]    History : MR brain w/wo  Provider : Anice Riis, DO  Contact : [phone]

## 2024-08-30 ENCOUNTER — Telehealth

## 2024-08-30 DIAGNOSIS — M79652 Pain in left thigh: Secondary | ICD-10-CM

## 2024-08-31 ENCOUNTER — Telehealth

## 2024-08-31 ENCOUNTER — Telehealth: Admitting: Physician Assistant

## 2024-08-31 DIAGNOSIS — M79652 Pain in left thigh: Secondary | ICD-10-CM | POA: Diagnosis not present

## 2024-08-31 MED ORDER — MELOXICAM 7.5 MG PO TABS: 7.5000 mg | ORAL_TABLET | Freq: Every day | ORAL | 0 refills | Status: DC

## 2024-08-31 NOTE — Progress Notes (Signed)
  Because of your previous injury, pain over the extremity and change in sensation, I feel your condition warrants further evaluation and I recommend that you be seen in a face-to-face visit.   NOTE: There will be NO CHARGE for this E-Visit   If you are having a true medical emergency, please call 911.     For an urgent face to face visit, [place] has multiple urgent care centers for your convenience.  Click the link below for the full list of locations and hours, walk-in wait times, appointment scheduling options and driving directions:  Urgent Care - [place], [place], [place], [place], [place], [place]  [place]     Your MyChart E-visit questionnaire answers were reviewed by a board certified advanced clinical practitioner to complete your personal care plan based on your specific symptoms.    Thank you for using e-Visits.

## 2024-08-31 NOTE — Progress Notes (Signed)
 Virtual Visit Consent   [name], you are scheduled for a virtual visit with a [facility] provider today. Just as with appointments in the office, your consent must be obtained to participate. Your consent will be active for this visit and any virtual visit you may have with one of our providers in the next 365 days. If you have a MyChart account, a copy of this consent can be sent to you electronically.  As this is a virtual visit, video technology does not allow for your provider to perform a traditional examination. This may limit your provider's ability to fully assess your condition. If your provider identifies any concerns that need to be evaluated in person or the need to arrange testing (such as labs, EKG, etc.), we will make arrangements to do so. Although advances in technology are sophisticated, we cannot ensure that it will always work on either your end or our end. If the connection with a video visit is poor, the visit may have to be switched to a telephone visit. With either a video or telephone visit, we are not always able to ensure that we have a secure connection.  By engaging in this virtual visit, you consent to the provision of healthcare and authorize for your insurance to be billed (if applicable) for the services provided during this visit. Depending on your insurance coverage, you may receive a charge related to this service.  I need to obtain your verbal consent now. Are you willing to proceed with your visit today? [name] has provided verbal consent on 08/31/2024 for a virtual visit (video or telephone). Delon CHRISTELLA Dickinson, PA-C  Date: 08/31/2024 6:24 PM   Virtual Visit via Video Note   I, Delon CHRISTELLA Dickinson, connected with  [name]  ([id number], [birth year]) on 08/31/24 at  6:00 PM EST by a video-enabled telemedicine application and verified that I am speaking with the correct person using two identifiers.  Location: Patient:  Virtual Visit Location Patient: Home Provider: Virtual Visit Location Provider: Home Office   I discussed the limitations of evaluation and management by telemedicine and the availability of in person appointments. The patient expressed understanding and agreed to proceed.    History of Present Illness: Susan [name] is a 63 y.o. who identifies as a female who was assigned female at birth, and is being seen today for stinging pain in the left upper thigh.  Has been present for over a month. Pain feels like a bee sting and sometimes sharper like a stabbing. Pain is intermittent. No known trigger, can happen with activity and with rest. There is no weakness, numbness, or tingling. Pain is normally localized over an area she states feels like a possible mass under the skin. However, occasionally can cause pain closer to her knee when it is more severe. Sometimes the area will feel more cold.  About a month prior to having the leg pain she did fall and reports her knee was back behind her and she hurt her little toe on the left foot. This did cause her to have issues with her gait and she does also think she bruised her leg in the area that she feels pain now.    Problems:  Patient Active Problem List   Diagnosis Date Noted   Immunization declined 12/23/2023   Hyperlipidemia LDL goal <100 12/23/2023   Encounter to establish care with new doctor 08/22/2021   Annual physical exam 08/22/2021   Major depressive disorder, recurrent  episode, moderate (HCC) 08/22/2021   History of colonoscopy 2010   ECZEMA 03/30/2008   COCCYGEAL PAIN 08/02/2007   DYSPNEA ON EXERTION 08/02/2007   FIBROIDS, UTERUS 08/01/2007   ANEMIA-IRON DEFICIENCY 08/01/2007   DEPRESSION 08/01/2007    Allergies: No Known Allergies Medications:  Current Outpatient Medications:    meloxicam (MOBIC) 7.5 MG tablet, Take 1 tablet (7.5 mg total) by mouth daily., Disp: 30 tablet, Rfl: 0   buPROPion  (WELLBUTRIN  XL) 150 MG 24 hr  tablet, Take 1 tablet (150 mg total) by mouth daily., Disp: 90 tablet, Rfl: 1   hydrOXYzine  (ATARAX ) 10 MG tablet, Take 1 tablet (10 mg total) by mouth at bedtime as needed. (Patient not taking: Reported on 08/22/2024), Disp: 30 tablet, Rfl: 0   Vitamin D , Cholecalciferol , 25 MCG (1000 UT) TABS, Take 5,000 Units by mouth once a week., Disp: 60 tablet, Rfl: 3  Current Facility-Administered Medications:    0.9 %  sodium chloride  infusion, 500 mL, Intravenous, Once, Susan Norleen SAILOR, MD  Observations/Objective: Patient is well-developed, well-nourished in no acute distress.  Resting comfortably at home.  Head is normocephalic, atraumatic.  No labored breathing.  Speech is clear and coherent with logical content.  Patient is alert and oriented at baseline.    Assessment and Plan: 1. Left thigh pain (Primary) - meloxicam (MOBIC) 7.5 MG tablet; Take 1 tablet (7.5 mg total) by mouth daily.  Dispense: 30 tablet; Refill: 0  - Discussed DDx: hematoma, varicosity, lipoma or cyst, neuropathy - Add Meloxicam as above to use as needed - Avoid OTC NSAIDs (Ibuprofen/Advil/Motrin or Naproxen/Aleve) while on Meloxicam - Tylenol if okay for breakthrough pain - Heat to area - Epsom salt soak if able to get in and out of bath tub safely - Seek in person evaluation if worsening or fails to improve with treatment   Follow Up Instructions: I discussed the assessment and treatment plan with the patient. The patient was provided an opportunity to ask questions and all were answered. The patient agreed with the plan and demonstrated an understanding of the instructions.  A copy of instructions were sent to the patient via MyChart unless otherwise noted below.    The patient was advised to call back or seek an in-person evaluation if the symptoms worsen or if the condition fails to improve as anticipated.    Delon CHRISTELLA Dickinson, PA-C

## 2024-08-31 NOTE — Patient Instructions (Signed)
 Susan [name], thank you for joining Susan [name], [name]-C for today's virtual visit.  While this provider is not your primary care provider (PCP), if your PCP is located in our provider database this encounter information will be shared with them immediately following your visit.   A [facility] MyChart account gives you access to today's visit and all your visits, tests, and labs performed at [facility]  click here if you don't have a [facility] MyChart account or go to [email or web address]  Consent: (Patient) Susan [name] provided verbal consent for this virtual visit at the beginning of the encounter.  Current Medications:  Current Outpatient Medications:    meloxicam (MOBIC) 7.5 MG tablet, Take 1 tablet (7.5 mg total) by mouth daily., Disp: 30 tablet, Rfl: 0   buPROPion  (WELLBUTRIN  XL) 150 MG 24 hr tablet, Take 1 tablet (150 mg total) by mouth daily., Disp: 90 tablet, Rfl: 1   hydrOXYzine  (ATARAX ) 10 MG tablet, Take 1 tablet (10 mg total) by mouth at bedtime as needed. (Patient not taking: Reported on 08/22/2024), Disp: 30 tablet, Rfl: 0   Vitamin D , Cholecalciferol , 25 MCG (1000 UT) TABS, Take 5,000 Units by mouth once a week., Disp: 60 tablet, Rfl: 3  Current Facility-Administered Medications:    0.9 %  sodium chloride  infusion, 500 mL, Intravenous, Once, [name], MD   Medications ordered in this encounter:  Meds ordered this encounter  Medications   meloxicam (MOBIC) 7.5 MG tablet    Sig: Take 1 tablet (7.5 mg total) by mouth daily.    Dispense:  30 tablet    Refill:  0    Supervising Provider:   LAMPTEY, PHILIP O [[id number]]     *If you need refills on other medications prior to your next appointment, please contact your pharmacy*  Follow-Up: Call back or seek an in-person evaluation if the symptoms worsen or if the condition fails to improve as anticipated.   Virtual Care [phone]  Other  Instructions Hematoma A hematoma is a collection of blood under the skin, in an organ, in a body space, in a joint space, or in other tissue. The blood can thicken (clot) to form a lump that you can see and feel. The lump is often firm and may become sore and tender. Most hematomas get better in a few days to weeks. However, some hematomas may be serious and require medical care. Hematomas can range from very small to very large. What are the causes? This condition is caused by: A blunt or penetrating injury. Leakage from a blood vessel under the skin. Some medical procedures, including surgeries, such as oral surgery, face lifts, and surgeries on the joints. Some medical conditions that cause bleeding or bruising. There may be multiple hematomas that appear in different areas of the body. What increases the risk? You are more likely to develop this condition if: You are an older adult. You use blood thinners. You regularly use NSAIDs, such as ibuprofen, for pain. You play contact sports. What are the signs or symptoms?  Symptoms of this condition depend on where the hematoma is located.  Common symptoms of a hematoma that is under the skin include: A firm lump on the body. Pain and tenderness in the area. Bruising. Blue, dark blue, purple-red, or yellowish skin (discoloration) may appear at the site of the hematoma if the hematoma is close to the surface of the skin. Common symptoms of a hematoma that is deep in the tissues  or body spaces may be less obvious. They include: A collection of blood in the stomach (intra-abdominal hematoma). This may cause pain in the abdomen, weakness, fainting, and shortness of breath. A collection of blood in the head (intracranial hematoma). This may cause a headache or symptoms such as weakness, trouble speaking or understanding, or a change in consciousness. How is this diagnosed? This condition is diagnosed based on: Your medical history. A physical  exam. Imaging tests, such as an ultrasound or CT scan. These may be needed if your health care provider suspects a hematoma in deeper tissues or body spaces. Blood tests. These may be needed if your health care provider believes that the hematoma is caused by a medical condition. How is this treated? Treatment for this condition depends on the cause, size, and location of the hematoma. Treatment may include: Doing nothing. The majority of hematomas do not need treatment as many of them go away on their own. Surgery or close monitoring. This may be needed for large hematomas or hematomas that affect vital organs. Medicines. Medicines may be given if there is an underlying medical cause for the hematoma. Follow these instructions at home: Managing pain, stiffness, and swelling  If directed, put ice on the injured area. To do this: Put ice in a plastic bag. Place a towel between your skin and the bag. Leave the ice on for 20 minutes, 2-3 times a day for the first couple of days. If your skin turns bright red, remove the ice right away to prevent skin damage. The risk of skin damage is higher if you cannot feel pain, heat, or cold. If directed, apply heat to the affected area as often as told by your health care provider. Use the heat source that your health care provider recommends, such as a moist heat pack or a heating pad. Place a towel between your skin and the heat source. Leave the heat on for 20-30 minutes. If your skin turns bright red, remove the heat right away to prevent burns. The risk of burns is higher if you cannot feel pain, heat, or cold. Raise (elevate) the injured area above the level of your heart while you are sitting or lying down. If directed, wrap the affected area with an elastic bandage. The bandage applies pressure (compression) to the area, which may help to reduce swelling and promote healing. Do not wrap the bandage too tightly around the affected area. If your  hematoma is on a leg or foot (lower extremity) and is painful, your health care provider may recommend crutches. Use them as told by your health care provider. General instructions Take over-the-counter and prescription medicines only as told by your health care provider. Rest the injured area as directed by your health care provider. Keep all follow-up visits. Your health care provider may want to see how your hematoma is progressing with treatment. Contact a health care provider if: You have a fever. The swelling or discoloration gets worse. You develop more hematomas. Your pain is worse or your pain is not controlled with medicine. Your skin over the hematoma breaks or starts bleeding. Get help right away if: Your hematoma is in your chest or abdomen and you have weakness, shortness of breath, or a change in consciousness. You have a hematoma on your scalp that is caused by a fall or injury, and you also have: A headache that gets worse. Trouble speaking or understanding speech. Weakness. A change in alertness or consciousness. These symptoms may be  an emergency. Get help right away. Call 911. Do not wait to see if the symptoms will go away. Do not drive yourself to the hospital. This information is not intended to replace advice given to you by your health care provider. Make sure you discuss any questions you have with your health care provider. Document Revised: 04/06/2022 Document Reviewed: 04/06/2022 Elsevier Patient Education  2024 Elsevier Inc.   If you have been instructed to have an in-person evaluation today at a local Urgent Care facility, please use the link below. It will take you to a list of all of our available [facility], including address, phone number and hours of operation. Please do not delay care.  [facility]  If you or a family member do not have a primary care provider, use the link below to schedule a visit and establish care. When  you choose a [facility] primary care physician or advanced practice provider, you gain a long-term partner in health. Find a Primary Care Provider  Learn more about [facility] in-office and virtual care options: [facility] - Get Care Now

## 2024-09-01 LAB — GENECONNECT MOLECULAR SCREEN: Genetic Analysis Overall Interpretation: NEGATIVE

## 2024-09-05 ENCOUNTER — Ambulatory Visit

## 2024-09-06 ENCOUNTER — Encounter (INDEPENDENT_AMBULATORY_CARE_PROVIDER_SITE_OTHER): Payer: Self-pay

## 2024-09-08 NOTE — Progress Notes (Incomplete)
 Assessment/Plan:   Susan [name] is a very pleasant 63 y.o. year old RH female with a history of hypertension, hyperlipidemia, depression seen today for evaluation of memory loss. MoCA today is /30.***.  MRI of the brain is reassuring, without acute intracranial abnormalities, mild chronic small vessel disease, no significant cerebral atrophy.  Patient is able to participate on ADLs***.  Patient continues to drive without significant difficulties.***    Memory Impairment of unclear etiology  MRI brain without contrast to assess for underlying structural abnormality and assess vascular load  Neurocognitive testing to further evaluate cognitive concerns and determine other underlying cause of memory changes, including potential contribution from sleep, anxiety, attention, or depression*** Replenish B12 (347) Continue to control mood as per PCP Recommend good control of cardiovascular risk factors Follow parotid gland lesion with ENT Fol[place] up pending on the above results***  Subjective:   The patient is accompanied by ***  who supplement  the history.   How long did patient have memory difficulties? For the last ***.  Reports some difficulty remembering new information, conversations and names.  Long-term memory is good. repeats oneself?  Endorsed Disoriented when walking into a room?  Patient denies except occasionally not remembering what patient came to the room for ***  Leaving objects in unusual places? Denies.   Wandering behavior?  denies .  Any personality changes?  Denies.   Any history of depression?:  Denies   Hallucinations or paranoia?  Denies   Seizures?  Denies    Any sleep changes?   Sleeps well***does not sleep well***denies vivid dreams, REM behavior or sleepwalking   Sleep apnea?  Denies   Any hygiene concerns?  Denies   Independent of bathing and dressing?  Endorsed  Does the patient needs help with medications? Patient is in charge *** Who is in  charge of the finances? Patient is in charge   *** Any changes in appetite?  Denies ***   Patient have trouble swallowing? Denies.  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 Does the patient cook? No ***  Any kitchen accidents such as leaving the stove on? Denies.   Any history of headaches?   Denies.   Chronic pain ? Denies.   Ambulates with difficulty?  Denies. *** Recent falls or head injuries? Denies.   Vision changes? Denies.   Any stroke like symptoms? Denies.   Any tremors?   Denies.   Any anosmia?  Denies.   Any incontinence of urine? Denies.   Any bowel dysfunction? Denies.      Patient lives with  *** History of heavy alcohol intake? Denies.   History of heavy tobacco use? Denies.   Family history of dementia?  Mother has dementia Does patient drive? Yes ***  Pertinent recent available labs: TSH 1.25, vitamin D3 27, B12 347***  MRI brain 06/13/2024 with and without contrast, personally reviewed, remarkable for mild chronic small vessel ischemic changes within the cerebral white matter, no age advanced or lobar predominant cerebral atrophy, no acute intracranial abnormalities or significant cerebral atrophy.***  Past Medical History:  Diagnosis Date   Depression    History of colonoscopy 2010   [place]     Past Surgical History:  Procedure Laterality Date   CESAREAN SECTION     COLONOSCOPY       No Known Allergies  Current Outpatient Medications  Medication Instructions  buPROPion  (WELLBUTRIN  XL) 150 mg, Oral, Daily   hydrOXYzine  (ATARAX ) 10 mg, Oral, At bedtime PRN   meloxicam (MOBIC) 7.5 mg, Oral, Daily   Vitamin D  (Cholecalciferol ) 5,000 Units, Oral, Weekly     VITALS:  There were no vitals filed for this visit.        No data to display              No data to display           Neurological Exam    Orientation:  Alert and oriented to person,  place and time. No aphasia or dysarthria. Fund of knowledge is appropriate. Recent memory impaired and remote memory intact.  Attention and concentration are normal.  Able to name objects and repeat phrases. Delayed recall  /5 Cranial nerves: There is good facial symmetry. Extraocular muscles are intact and visual fields are full to confrontational testing. Speech is fluent and clear. No tongue deviation. Hearing is intact to conversational tone. Tone: Tone is good throughout. Abnormal movements: No tremors. No Asterixis. No Fasciculations Sensation: Sensation is intact to light touch. Vibration is intact at the bilateral big toe.  Coordination: The patient has no difficulty with RAM's or FNF bilaterally. Normal finger to nose  Motor: Strength is 5/5 in the bilateral upper and lower extremities. There is no pronator drift. There are no fasciculations noted. DTR's: Deep tendon reflexes are 2/4 bilaterally. Gait and Station: The patient is able to ambulate without difficulty The patient is able to heel toe walk. Gait is cautious and narrow. The patient is able to ambulate in a tandem fashion.       Thank you for allowing us  the opportunity to participate in the care of this nice patient. Please do not hesitate to contact us  for any questions or concerns.   Total time spent on today's visit was *** minutes dedicated to this patient today, preparing to see patient, examining the patient, ordering tests and/or medications and counseling the patient, documenting clinical information in the EHR or other health record, independently interpreting results and communicating results to the patient/family, discussing treatment and goals, answering patient's questions and coordinating care.  Cc:  Ngetich, Roxan BROCKS, NP  Camie Sevin 09/08/2024 6:35 AM

## 2024-09-11 ENCOUNTER — Institutional Professional Consult (permissible substitution) (INDEPENDENT_AMBULATORY_CARE_PROVIDER_SITE_OTHER): Admitting: Nurse Practitioner

## 2024-09-12 ENCOUNTER — Encounter: Payer: Self-pay | Admitting: Physician Assistant

## 2024-09-13 ENCOUNTER — Other Ambulatory Visit

## 2024-09-13 ENCOUNTER — Ambulatory Visit (INDEPENDENT_AMBULATORY_CARE_PROVIDER_SITE_OTHER): Payer: Self-pay | Admitting: Physician Assistant

## 2024-09-13 ENCOUNTER — Ambulatory Visit

## 2024-09-13 ENCOUNTER — Encounter: Payer: Self-pay | Admitting: Physician Assistant

## 2024-09-13 VITALS — BP 134/81 | HR 77 | Resp 20 | Ht 63.0 in | Wt 215.0 lb

## 2024-09-13 DIAGNOSIS — R413 Other amnesia: Secondary | ICD-10-CM

## 2024-09-13 DIAGNOSIS — Z0289 Encounter for other administrative examinations: Secondary | ICD-10-CM

## 2024-09-13 NOTE — Patient Instructions (Addendum)
 It was a pleasure to see you today at our office.   Recommendations:   Check labs today   Discuss with your primary doctor the sleep issues Replenish B12 and D Follow up pending on the results    [email or web address]   RECOMMENDATIONS FOR ALL PATIENTS WITH MEMORY PROBLEMS: 1. Continue to exercise (Recommend 30 minutes of walking everyday, or 3 hours every week) 2. Increase social interactions - continue going to [place] and enjoy social gatherings with friends and family 3. Eat healthy, avoid fried foods and eat more fruits and vegetables 4. Maintain adequate blood pressure, blood sugar, and blood cholesterol level. Reducing the risk of stroke and cardiovascular disease also helps promoting better memory. 5. Avoid stressful situations. Live a simple life and avoid aggravations. Organize your time and prepare for the next day in anticipation. 6. Sleep well, avoid any interruptions of sleep and avoid any distractions in the bedroom that may interfere with adequate sleep quality 7. Avoid sugar, avoid sweets as there is a strong link between excessive sugar intake, diabetes, and cognitive impairment We discussed the Mediterranean diet, which has been shown to help patients reduce the risk of progressive memory disorders and reduces cardiovascular risk. This includes eating fish, eat fruits and green leafy vegetables, nuts like almonds and hazelnuts, walnuts, and also use olive oil. Avoid fast foods and fried foods as much as possible. Avoid sweets and sugar as sugar use has been linked to worsening of memory function.  There is always a concern of gradual progression of memory problems. If this is the case, then we may need to adjust level of care according to patient needs. Support, both to the patient and caregiver, should then be put into place.          DRIVING: Regarding driving, in patients with progressive memory problems,  driving will be impaired. We advise to have someone else do the driving if trouble finding directions or if minor accidents are reported. Independent driving assessment is available to determine safety of driving.   If you are interested in the driving assessment, you can contact the following:  [facility] [employer] [facility] in [place] [phone]  [facility] [phone]  [facility] [phone] or [phone]          Mediterranean Diet A Mediterranean diet refers to food and lifestyle choices that are based on the traditions of countries located on the [employer]. This way of eating has been shown to help prevent certain conditions and improve outcomes for people who have chronic diseases, like kidney disease and heart disease. What are tips for following this plan? Lifestyle  Cook and eat meals together with your family, when possible. Drink enough fluid to keep your urine clear or pale yellow. Be physically active every day. This includes: Aerobic exercise like running or swimming. Leisure activities like gardening, walking, or housework. Get 7-8 hours of sleep each night. If recommended by your health care provider, drink red wine in moderation. This means 1 glass a day for nonpregnant women and 2 glasses a day for men. A glass of wine equals 5 oz (150 mL). Reading food labels  Check the serving size of packaged foods. For foods such as rice and pasta, the serving size refers to the amount of cooked product, not dry. Check the total fat in packaged foods. Avoid foods that have saturated fat or trans fats. Check the ingredients list for added sugars, such as corn syrup. Shopping  At  the grocery store, buy most of your food from the areas near the walls of the store. This includes: Fresh fruits and vegetables (produce). Grains, beans, nuts, and seeds. Some of these may be available in unpackaged forms or  large amounts (in bulk). Fresh seafood. Poultry and eggs. Low-fat dairy products. Buy whole ingredients instead of prepackaged foods. Buy fresh fruits and vegetables in-season from local farmers markets. Buy frozen fruits and vegetables in resealable bags. If you do not have access to quality fresh seafood, buy precooked frozen shrimp or canned fish, such as tuna, salmon, or sardines. Buy small amounts of raw or cooked vegetables, salads, or olives from the deli or salad bar at your store. Stock your pantry so you always have certain foods on hand, such as olive oil, canned tuna, canned tomatoes, rice, pasta, and beans. Cooking  Cook foods with extra-virgin olive oil instead of using butter or other vegetable oils. Have meat as a side dish, and have vegetables or grains as your main dish. This means having meat in small portions or adding small amounts of meat to foods like pasta or stew. Use beans or vegetables instead of meat in common dishes like chili or lasagna. Experiment with different cooking methods. Try roasting or broiling vegetables instead of steaming or sauteing them. Add frozen vegetables to soups, stews, pasta, or rice. Add nuts or seeds for added healthy fat at each meal. You can add these to yogurt, salads, or vegetable dishes. Marinate fish or vegetables using olive oil, lemon juice, garlic, and fresh herbs. Meal planning  Plan to eat 1 vegetarian meal one day each week. Try to work up to 2 vegetarian meals, if possible. Eat seafood 2 or more times a week. Have healthy snacks readily available, such as: Vegetable sticks with hummus. Greek yogurt. Fruit and nut trail mix. Eat balanced meals throughout the week. This includes: Fruit: 2-3 servings a day Vegetables: 4-5 servings a day Low-fat dairy: 2 servings a day Fish, poultry, or lean meat: 1 serving a day Beans and legumes: 2 or more servings a week Nuts and seeds: 1-2 servings a day Whole grains: 6-8 servings a  day Extra-virgin olive oil: 3-4 servings a day Limit red meat and sweets to only a few servings a month What are my food choices? Mediterranean diet Recommended Grains: Whole-grain pasta. Brown rice. Bulgar wheat. Polenta. Couscous. Whole-wheat bread. [place] Made[place]. Vegetables: Artichokes. Beets. Broccoli. Cabbage. Carrots. Eggplant. Green beans. Chard. Kale. Spinach. Onions. Leeks. Peas. Squash. Tomatoes. Peppers. Radishes. Fruits: Apples. Apricots. Avocado. Berries. Bananas. Cherries. Dates. Figs. Grapes. Lemons. Melon. Oranges. Peaches. Plums. Pomegranate. Meats and other protein foods: Beans. Almonds. Sunflower seeds. Pine nuts. Peanuts. Cod. Salmon. Scallops. Shrimp. Tuna. Tilapia. Clams. Oysters. Eggs. Dairy: Low-fat milk. Cheese. Greek yogurt. Beverages: Water. Red wine. Herbal tea. Fats and oils: Extra virgin olive oil. Avocado oil. Grape seed oil. Sweets and desserts: Greek yogurt with honey. Baked apples. Poached pears. Trail mix. Seasoning and other foods: Basil. Cilantro. Coriander. Cumin. Mint. Parsley. Sage. Rosemary. Tarragon. Garlic. Oregano. Thyme. Pepper. Balsalmic vinegar. Tahini. Hummus. Tomato sauce. Olives. Mushrooms. Limit these Grains: Prepackaged pasta or rice dishes. Prepackaged cereal with added sugar. Vegetables: Deep fried potatoes (french fries). Fruits: Fruit canned in syrup. Meats and other protein foods: Beef. Pork. Lamb. Poultry with skin. Hot dogs. [place]. Dairy: Ice cream. Sour cream. Whole milk. Beverages: Juice. Sugar-sweetened soft drinks. Beer. Liquor and spirits. Fats and oils: Butter. Canola oil. Vegetable oil. Beef fat (tallow). Lard. Sweets and desserts: Cookies. Cakes.  Pies. Candy. Seasoning and other foods: Mayonnaise. Premade sauces and marinades. The items listed may not be a complete list. Talk with your dietitian about what dietary choices are right for you. Summary The Mediterranean diet includes both food and lifestyle choices. Eat a  variety of fresh fruits and vegetables, beans, nuts, seeds, and whole grains. Limit the amount of red meat and sweets that you eat. Talk with your health care provider about whether it is safe for you to drink red wine in moderation. This means 1 glass a day for nonpregnant women and 2 glasses a day for men. A glass of wine equals 5 oz (150 mL). This information is not intended to replace advice given to you by your health care provider. Make sure you discuss any questions you have with your health care provider. Document Released: 06/04/2016 Document Revised: 07/07/2016 Document Reviewed: 06/04/2016 Elsevier Interactive Patient Education  2017 [employer].

## 2024-09-14 ENCOUNTER — Ambulatory Visit (INDEPENDENT_AMBULATORY_CARE_PROVIDER_SITE_OTHER): Admitting: Nurse Practitioner

## 2024-09-14 ENCOUNTER — Encounter (INDEPENDENT_AMBULATORY_CARE_PROVIDER_SITE_OTHER): Payer: Self-pay | Admitting: Nurse Practitioner

## 2024-09-14 VITALS — BP 129/78 | HR 77 | Temp 99.1°F | Ht 63.0 in | Wt 213.0 lb

## 2024-09-14 DIAGNOSIS — Z6837 Body mass index (BMI) 37.0-37.9, adult: Secondary | ICD-10-CM

## 2024-09-14 DIAGNOSIS — F331 Major depressive disorder, recurrent, moderate: Secondary | ICD-10-CM

## 2024-09-14 DIAGNOSIS — E66812 Obesity, class 2: Secondary | ICD-10-CM | POA: Diagnosis not present

## 2024-09-14 DIAGNOSIS — E7800 Pure hypercholesterolemia, unspecified: Secondary | ICD-10-CM | POA: Diagnosis not present

## 2024-09-14 NOTE — Progress Notes (Signed)
 [address] [place], [place], [place] [address] Office: [phone]  /  Fax: [phone]   Initial Consultation    Susan [name] was seen in clinic today to evaluate for obesity. She is interested in losing weight to improve overall health and reduce the risk of weight related complications. She presents today to review program treatment options, initial physical assessment, and evaluation.    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 Very sedentary job. M-F 7:30-4. She does have a history of pure hypercholesterolemia and is trying a mediterranean diet to help control.   She does deal with depression but symptoms are fairly controlled with use of Wellbutrin  XL 150 mg every day- follows with her PCP  She began to notice weight gain about 15 years after her last child which would have been around 2007. Noticed gradual weight gain until about 2012 and then had a plateau until the past several years when she started to notice weight gain again.  She has tried Nutrisystem and intermittent fasting but did not have successful weight loss with these diets. She does try to walk 30 minutes 2 days a week but since moving to her new home in new neighborhood 1 year ago has not done as much walking.   Anthropometrics and Bioimpedance Analysis   Body mass index is 37.73 kg/m. Body Fat Mass : 44.6 % Visceral Fat Mass Rating : 14   Obesity Related Diseases and Complications  Obesity Quality of Life and Psychosocial Complications: Body image dissatisfaction, Reduced health-related quality of life, and Decrease physical activity and social participation  Cardiometabolic: Dyslipidemia or hypercholesterolemia, DOE, and Fatigue  Biomechanical: Osteoarthritis of the knee or hip and Low back pain   Weight Related History  She was referred by: PCP  When asked what they would like to accomplish? She states: Adopt a healthier eating pattern and lifestyle, Improve  energy levels and physical activity, Improve existing medical conditions, Improve quality of life, Improve appearance, Improve self-confidence, and Lose 40 lbs  Weight history: Started to notice weight gain 15 years after last child 2007- she went through a divorce, had 2 layoffs and moved several times and noticed maintenance for 10 years and has started to regain again.   Highest weight: 215  Contributing factors: consumption of processed foods, moderate to high levels of stress, reduced physical activity, chronic skipping of meals, menopause, slow metabolism for age, need for convenience due to lack of time, multiple weight loss attempts in the past, sedentary job, and need for convenient foods  Prior weight loss attempts: Nutrisystem and Intermittent fasting  Current or previous pharmacotherapy: None and Is interested in pharmacotherapy  Response to medication: Never tried medications  Current nutrition plan: Portion control / smart choices  Greatest challenge with dieting: craves sweets.  Current level of physical activity: Walking 30 minutes, twice a week  Barriers to Exercise: motivation  Readiness and Motivation  On a scale from 0 to 10 How ready are you to make changes to your eating and physical activity to lose weight? 10 How important is it for you to lose weight right now ? 10 How confident are you that you can lose weight if you try? 7  Past Medical History   Past Medical History:  Diagnosis Date   Depression    History of colonoscopy 2010   [place]     Objective    BP 129/78   Pulse 77   Temp 99.1 F (37.3 C)  Ht 5' 3 (1.6 m)   Wt 213 lb (96.6 kg)   SpO2 98%   BMI 37.73 kg/m  She was weighed on the bioimpedance scale: Body mass index is 37.73 kg/m.    General:  Alert, oriented and cooperative. Patient is in no acute distress.  Respiratory: Normal respiratory effort, no problems with respiration noted   Gait: able to ambulate independently   Mental Status: Normal mood and affect. Normal behavior. Normal judgment and thought content.   Diagnostic Data Reviewed  BMET    Component Value Date/Time   NA 143 12/23/2023 1017   K 4.4 12/23/2023 1017   CL 105 12/23/2023 1017   CO2 30 12/23/2023 1017   GLUCOSE 88 12/23/2023 1017   BUN 11 12/23/2023 1017   CREATININE 0.87 12/23/2023 1017   CALCIUM  9.7 12/23/2023 1017   GFRNONAA 72 01/19/2008 1528   GFRAA 87 01/19/2008 1528   No results found for: HGBA1C No results found for: INSULIN CBC    Component Value Date/Time   WBC 3.4 (L) 12/23/2023 1017   RBC 3.98 12/23/2023 1017   HGB 11.3 (L) 12/23/2023 1017   HGB 10.4 (L) 02/14/2010 0842   HCT 35.4 12/23/2023 1017   HCT 30.8 (L) 02/14/2010 0842   PLT 216 12/23/2023 1017   PLT 184 02/14/2010 0842   MCV 88.9 12/23/2023 1017   MCV 84.3 02/14/2010 0842   MCH 28.4 12/23/2023 1017   MCHC 31.9 (L) 12/23/2023 1017   RDW 14.7 12/23/2023 1017   RDW 19.3 (H) 02/14/2010 0842   Iron/TIBC/Ferritin/ %Sat    Component Value Date/Time   IRON 43 02/14/2010 0842   TIBC 422 02/14/2010 0842   FERRITIN 9 (L) 02/14/2010 0842   IRONPCTSAT 10 (L) 02/14/2010 0842   Lipid Panel     Component Value Date/Time   CHOL 220 (H) 12/23/2023 1017   TRIG 68 12/23/2023 1017   HDL 96 12/23/2023 1017   CHOLHDL 2.3 12/23/2023 1017   VLDL 15.4 09/09/2021 0919   LDLCALC 109 (H) 12/23/2023 1017   Hepatic Function Panel     Component Value Date/Time   PROT 6.7 12/23/2023 1017   ALBUMIN 4.4 09/09/2021 0919   AST 17 12/23/2023 1017   ALT 16 12/23/2023 1017   ALKPHOS 67 09/09/2021 0919   BILITOT 0.5 12/23/2023 1017   BILIDIR 0.1 01/19/2008 1528      Component Value Date/Time   TSH 1.25 12/23/2023 1017    Medications  Outpatient Encounter Medications as of 09/14/2024  Medication Sig   buPROPion  (WELLBUTRIN  XL) 150 MG 24 hr tablet Take 1 tablet (150 mg total) by mouth daily.   Vitamin D , Cholecalciferol , 25 MCG (1000 UT) TABS Take 5,000  Units by mouth once a week.   [DISCONTINUED] meloxicam  (MOBIC ) 7.5 MG tablet Take 1 tablet (7.5 mg total) by mouth daily.   [DISCONTINUED] hydrOXYzine  (ATARAX ) 10 MG tablet Take 1 tablet (10 mg total) by mouth at bedtime as needed. (Patient not taking: Reported on 08/22/2024)   Facility-Administered Encounter Medications as of 09/14/2024  Medication   0.9 %  sodium chloride  infusion     Assessment and Plan   Pure hypercholesterolemia Continue to limit saturated fats Loss of 10-15% body weight can improve lipid levels Follow with PCP regularly  Major depressive disorder, recurrent episode, moderate (HCC)      Symptoms are currently fairly controlled with Wellbutrin  XL 150 mg every day       Continue to monitor symptoms and follow regularly with PCP.  Class 2 severe obesity with serious comorbidity and body mass index (BMI) of 37.0 to 37.9 in adult, unspecified obesity type Obesity Treatment and Action Plan:  Patient will work on garnering support from family and friends to begin weight loss journey. Will work on eliminating or reducing the presence of highly palatable, calorie dense foods in the home. Will complete provided nutritional and psychosocial assessment questionnaire before the next appointment. Will be scheduled for indirect calorimetry to determine resting energy expenditure in a fasting state.  This will allow us  to create a reduced calorie, high-protein meal plan to promote loss of fat mass while preserving muscle mass. Counseled on the health benefits of losing 5%-15% of total body weight. Was counseled on nutritional approaches to weight loss and benefits of reducing processed foods and consuming plant-based foods and high quality protein as part of nutritional weight management. Was counseled on pharmacotherapy and role as an adjunct in weight management.   Education and Additional resources  She was weighed on the bioimpedance scale and results were discussed and  documented in the synopsis.  We discussed obesity as a progressive, chronic disease and the importance of a more detailed evaluation of all the factors contributing to the disease.  We reviewed the basic principles in obesity management.   We discussed the importance of long term lifestyle changes which include nutrition, exercise and behavioral modification as well as the importance of customizing this to her specific health and social needs.  We reviewed the role of medical interventions including pharmacotherapy and surgical interventions.   We discussed the benefits of reaching a healthier weight to alleviate the symptoms of existing conditions and reduce the risks of the biomechanical, cardiometabolic and psychological effects of obesity.  We reviewed our program approach and philosophy, which are guided by the four pillars of obesity medicine.  We discussed how to prepare for intake appointment and the importance of fasting and avoidance of stimulants for at least 8 hours prior to indirect calorimetry.  Susan [name] [place] appears to be in the action stage of change and reports being ready to initiate intensive lifestyle and behavioral modifications as part of their weight loss journey.  Attestation  Reviewed by clinician on day of visit: allergies, medications, problem list, medical history, surgical history, family history, social history, and previous encounter notes pertinent to obesity diagnosis.   I personally spent a total of 30 minutes in the care of the patient today including preparing to see the patient, getting/reviewing separately obtained history, performing a medically appropriate exam/evaluation, counseling and educating, and documenting clinical information in the EHR.  Vermon Grays ANP-C

## 2024-09-15 ENCOUNTER — Ambulatory Visit

## 2024-09-16 LAB — P-TAU217/BETA-AMYLOID 1-42 RATIO, PLASMA
ABETA 42: 25.2 pg/mL
PTAU217/ABETA 42 RATIO: 0.0027
PTAU217: 0.067 pg/mL

## 2024-09-17 ENCOUNTER — Ambulatory Visit: Payer: Self-pay | Admitting: Physician Assistant

## 2024-09-18 ENCOUNTER — Telehealth: Payer: Self-pay | Admitting: Physician Assistant

## 2024-09-18 NOTE — Progress Notes (Signed)
 No answer at 1:44pm 09/18/2024

## 2024-09-18 NOTE — Progress Notes (Signed)
 I advised of normal results, patient thanked me for calling. Verbally understood results.

## 2024-09-18 NOTE — Telephone Encounter (Signed)
 Pt is returning a call. Please call back. Thanks

## 2024-10-10 ENCOUNTER — Ambulatory Visit (INDEPENDENT_AMBULATORY_CARE_PROVIDER_SITE_OTHER): Admitting: Nurse Practitioner

## 2024-10-10 ENCOUNTER — Encounter (INDEPENDENT_AMBULATORY_CARE_PROVIDER_SITE_OTHER): Payer: Self-pay | Admitting: Nurse Practitioner

## 2024-10-10 VITALS — BP 128/73 | HR 70 | Temp 98.1°F | Ht 63.0 in | Wt 215.0 lb

## 2024-10-10 DIAGNOSIS — E7800 Pure hypercholesterolemia, unspecified: Secondary | ICD-10-CM | POA: Diagnosis not present

## 2024-10-10 DIAGNOSIS — Z6838 Body mass index (BMI) 38.0-38.9, adult: Secondary | ICD-10-CM

## 2024-10-10 DIAGNOSIS — T733XXA Exhaustion due to excessive exertion, initial encounter: Secondary | ICD-10-CM | POA: Insufficient documentation

## 2024-10-10 DIAGNOSIS — R5383 Other fatigue: Secondary | ICD-10-CM | POA: Diagnosis not present

## 2024-10-10 DIAGNOSIS — E559 Vitamin D deficiency, unspecified: Secondary | ICD-10-CM

## 2024-10-10 DIAGNOSIS — R0602 Shortness of breath: Secondary | ICD-10-CM | POA: Diagnosis not present

## 2024-10-10 DIAGNOSIS — F331 Major depressive disorder, recurrent, moderate: Secondary | ICD-10-CM

## 2024-10-10 DIAGNOSIS — Z1331 Encounter for screening for depression: Secondary | ICD-10-CM | POA: Diagnosis not present

## 2024-10-10 DIAGNOSIS — E66812 Obesity, class 2: Secondary | ICD-10-CM | POA: Diagnosis not present

## 2024-10-10 NOTE — Progress Notes (Signed)
 [address] [place],  [place], [place] [address]  Office: [phone]  /  Fax: [phone]   Subjective   Initial Visit  Susan [name] (MR# [id number]) is a 63 y.o. female who presents for evaluation and treatment of obesity and related comorbidities. Current BMI is Body mass index is 38.09 kg/m. Susan [name] has been struggling with her weight for many years and has been unsuccessful in either losing weight, maintaining weight loss, or reaching her healthy weight goal.  Susan [name] is currently in the action stage of change and ready to dedicate time achieving and maintaining a healthier weight. Susan [name] is interested in becoming our patient and working on intensive lifestyle modifications including (but not limited to) diet and exercise for weight loss.  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 Very sedentary job. M-F 7:30-4. Susan [name] does have a history of pure hypercholesterolemia and is trying a mediterranean diet to help control as well as starting on weight loss journey.   Susan [name] does deal with depression but symptoms are fairly controlled with use of Wellbutrin  XL 150 mg every day- follows with her PCP  Susan [name] began to notice weight gain about 15 years after her last child which would have been around 2007. Noticed gradual weight gain until about 2012 and then had a plateau until the past several years when Susan [name] started to notice weight gain again.  Susan [name] has tried Nutrisystem and intermittent fasting but did not have successful weight loss with these diets. Susan [name] does try to walk 30 minutes 2 days a week but since moving to her new home in new neighborhood 1 year ago has not done as much walking.   Weight history:  When asked how their weight has affected their life and health, Susan [name] states: Has affected self-esteem, Contributed to medical problems, Contributed to orthopedic problems or mobility issues, Having fatigue, Having poor endurance, and Has affected mood   When asked  what else they would like to accomplish? Susan [name] states: Adopt a healthier eating pattern and lifestyle, Improve energy levels and physical activity, Improve existing medical conditions, Improve quality of life, Improve appearance, Improve self-confidence, and Lose 40 lbs  Susan [name] starting to note weight gain during : adulthood.  Life events associated with weight gain include : divorce.   Other contributing factors: consumption of processed foods, moderate to high levels of stress, reduced physical activity, chronic skipping of meals, menopause, slow metabolism for age, need for convenience due to lack of time, multiple weight loss attempts in the past, sedentary job, and need for convenient foods.  Their highest weight has been:  215 lbs.  Desired weight: 160  Previous weight-loss programs : Nutrisystem and Intermittent fasting.  Their greatest challenge with dieting: sweets cravings.  Current or previous pharmacotherapy: None and Is interested in pharmacotherapy.  Response to medication: Never tried medications   Nutritional History:  Current nutrition plan: Portion control / smart choices.  How many times do you eat outside the home: 1-2 per week  How often do they skip meals: skips breakfast, skips lunch, and skips dinner multiple times a week  What beverages do they drink: water, caffeinated beverages , regular soda , juice, and sweet tea .   Use of artificial sweetners : Yes  Food intolerances or dislikes: pork.  Food triggers: Stress, Seeking reward, and When Sad.  Food cravings: Sugary  Do they struggle with excessive hunger or portion control : No    Physical Activity:  Current level of physical activity:  Walking 30 minutes, twice a week  Barriers to Exercise: motivation   Past medical history includes:   Past Medical History:  Diagnosis Date   Anemia    Anxiety    Back pain    Bilateral swelling of feet    Depression    High cholesterol    History of  colonoscopy 2010   [facility]   Joint pain    Knee pain    Vitamin D  deficiency      Objective   BP 128/73   Pulse 70   Temp 98.1 F (36.7 C)   Ht 5' 3 (1.6 m)   Wt 215 lb (97.5 kg)   SpO2 95%   BMI 38.09 kg/m  Susan [name] was weighed on the bioimpedance scale: Body mass index is 38.09 kg/m.    Anthropometrics:  Vitals Temp: 98.1 F (36.7 C) BP: 128/73 Pulse Rate: 70 SpO2: 95 %   Anthropometric Measurements Height: 5' 3 (1.6 m) Weight: 215 lb (97.5 kg) BMI (Calculated): 38.09 Peak Weight: 215 lb   Body Composition  Body Fat %: 46 % Fat Mass (lbs): 99 lbs Muscle Mass (lbs): 110.4 lbs Total Body Water (lbs): 78.8 lbs Visceral Fat Rating : 14   Other Clinical Data RMR: [id number] Fasting: yes Labs: yes Today's Visit #: 1 Starting Date: 10/10/24    Physical Exam:  General: Susan [name] is overweight, cooperative, alert, well developed, and in no acute distress. PSYCH: Has normal mood, affect and thought process.   HEENT: EOMI, sclerae are anicteric. Lungs: Normal breathing effort, no conversational dyspnea. Extremities: No edema.  Neurologic: No gross sensory or motor deficits. No tremors or fasciculations noted.    Diagnostic Data Reviewed  EKG: Normal sinus rhythm, rate 66. No conduction abnormalities, abnormal Q waves or chamber enlargement.  Indirect Calorimeter completed today shows a VO2 of 257 and a REE of 1771.  Her calculated basal metabolic rate is 8372 thus her resting energy expenditure faster than calculated.  Depression Screen  Susan [name]'s PHQ-9 score was: 12 Susan [name] is currently on Wellbutrin  XL 150 mg every day  .     10/10/2024    9:12 AM  Depression screen PHQ 2/9  Decreased Interest 2  Down, Depressed, Hopeless 2  PHQ - 2 Score 4  Altered sleeping 3  Tired, decreased energy 1  Change in appetite 1  Feeling bad or failure about yourself  0  Trouble concentrating 3  Moving slowly or fidgety/restless 0  Suicidal thoughts 0  PHQ-9 Score 12   Difficult doing work/chores Somewhat difficult    Screening for Sleep Related Breathing Disorders  Susan [name] admits to daytime somnolence and admits to waking up still tired. Patient has a history of symptoms of daytime fatigue and morning fatigue. Susan [name] generally gets 4 hours of sleep per night, and states that Susan [name] has difficulty falling asleep, nightime awakenings, and difficulty falling back asleep if awakened. Snoring is present. Apneic episodes are not present. Epworth Sleepiness Score is 13.   BMET    Component Value Date/Time   NA 143 12/23/2023 1017   K 4.4 12/23/2023 1017   CL 105 12/23/2023 1017   CO2 30 12/23/2023 1017   GLUCOSE 88 12/23/2023 1017   BUN 11 12/23/2023 1017   CREATININE 0.87 12/23/2023 1017   CALCIUM  9.7 12/23/2023 1017   GFRNONAA 72 01/19/2008 1528   GFRAA 87 01/19/2008 1528   No results found for: HGBA1C No results found for: INSULIN  CBC    Component Value Date/Time   WBC 3.4 (  L) 12/23/2023 1017   RBC 3.98 12/23/2023 1017   HGB 11.3 (L) 12/23/2023 1017   HGB 10.4 (L) 02/14/2010 0842   HCT 35.4 12/23/2023 1017   HCT 30.8 (L) 02/14/2010 0842   PLT 216 12/23/2023 1017   PLT 184 02/14/2010 0842   MCV 88.9 12/23/2023 1017   MCV 84.3 02/14/2010 0842   MCH 28.4 12/23/2023 1017   MCHC 31.9 (L) 12/23/2023 1017   RDW 14.7 12/23/2023 1017   RDW 19.3 (H) 02/14/2010 0842   Iron/TIBC/Ferritin/ %Sat    Component Value Date/Time   IRON 43 02/14/2010 0842   TIBC 422 02/14/2010 0842   FERRITIN 9 (L) 02/14/2010 0842   IRONPCTSAT 10 (L) 02/14/2010 0842   Lipid Panel     Component Value Date/Time   CHOL 220 (H) 12/23/2023 1017   TRIG 68 12/23/2023 1017   HDL 96 12/23/2023 1017   CHOLHDL 2.3 12/23/2023 1017   VLDL 15.4 09/09/2021 0919   LDLCALC 109 (H) 12/23/2023 1017   Hepatic Function Panel     Component Value Date/Time   PROT 6.7 12/23/2023 1017   ALBUMIN 4.4 09/09/2021 0919   AST 17 12/23/2023 1017   ALT 16 12/23/2023 1017   ALKPHOS 67  09/09/2021 0919   BILITOT 0.5 12/23/2023 1017   BILIDIR 0.1 01/19/2008 1528      Component Value Date/Time   TSH 1.25 12/23/2023 1017     Assessment and Plan   Class 2 severe obesity with serious comorbidity and body mass index (BMI) of 38.0 to 38.9 in adult, unspecified obesity type  TREATMENT PLAN FOR OBESITY:  Recommended Dietary Goals  Veeda is currently in the action stage of change. As such, her goal is to implement medically supervised obesity management plan.  Susan [name] has agreed to implement: the Category 2 plan - 1200 kcal per day  Behavioral Intervention  We discussed the following Behavioral Modification Strategies today: increasing lean protein intake to established goals, decreasing simple carbohydrates , increasing vegetables, increasing lower glycemic fruits, increasing fiber rich foods, avoiding skipping meals, increasing water intake, work on meal planning and preparation, reading food labels , keeping healthy foods at home, identifying sources and decreasing liquid calories, decreasing eating out or consumption of processed foods, and making healthy choices when eating convenient foods, planning for success, and better snacking choices  Additional resources provided today: Handout on healthy eating and balanced plate, Handout on complex carbohydrates and lean sources of protein, Category 2 packet, and Handout principles of weight management  Recommended Physical Activity Goals  Shaddai has been advised to work up to 150 minutes of moderate intensity aerobic activity a week and strengthening exercises 2-3 times per week for cardiovascular health, weight loss maintenance and preservation of muscle mass.   Susan [name] has agreed to :  Continue current level of physical activity , Think about enjoyable ways to increase daily physical activity and overcoming barriers to exercise, and Increase physical activity in their day and reduce sedentary time (increase NEAT).  Medical  Interventions and Pharmacotherapy We will work on building a therapist, art and behavioral strategies. We will discuss the role of pharmacotherapy as an adjunct at subsequent visits.   ASSOCIATED CONDITIONS ADDRESSED TODAY  Other Fatigue Teegan does feel that her weight is causing her energy to be lower than it should be. Fatigue may be related to obesity, depression or many other causes. Labs will be ordered, and in the meanwhile, Kattie will focus on self care including making healthy food choices, increasing  physical activity and focusing on stress reduction. - EKG 12 lead  Shortness of Breath on exertion Bernadean notes increasing shortness of breath with physical activity and seems to be worsening over time with weight gain. Susan [name] notes getting out of breath sooner with activity than Susan [name] used to. This has not gotten worse recently. Shaaron denies shortness of breath at rest or orthopnea. - indirect calorimetry  Depression screening [Z13.31]      Scored a 12 on PHQ 9. Currently on Wellbutrin  XL 150 mg every day and followed by PCP, does not feel need for further intervention  Pure hypercholesterolemia Focus on implementing category 2 meal plan, limit saturated fats. Increase lean protein, fiber and water Loss of 10-15% body weight can improve lipid levels Focus on getting 150 minutes a week of moderate to high intensity exercise  Continue to follow regularly with PCP  Major depressive disorder, recurrent episode, moderate (HCC)       Currently on Wellbutrin  XL 150 mg every day and followed by PCP, does not feel need for further intervention       Monitor symptoms and continue to follow with PCP regularly  Class 2 severe obesity with serious comorbidity and body mass index (BMI) of 38.0 to 38.9 in adult, unspecified obesity type See plan above -     Comprehensive metabolic panel with GFR -     Hemoglobin A1c -     Insulin , random -     Magnesium -     Vitamin B12 -      VITAMIN D  25 Hydroxy (Vit-D Deficiency, Fractures)  Vitamin D  deficiency Currently on Cholecalciferol  5000 units once a week -     VITAMIN D  25 Hydroxy (Vit-D Deficiency, Fractures)    Follow-up  Susan [name] was informed of the importance of frequent follow-up visits to maximize her success with intensive lifestyle modifications for her multiple health conditions. Susan [name] was informed we would discuss her lab results at her next visit unless there is a critical issue that needs to be addressed sooner. Asianae agreed to keep her next visit at the agreed upon time to discuss these results.  Attestation Statement  This is the patient's intake visit at [facility]. The patient's Health Questionnaire was reviewed at length. Included in the packet: current and past health history, medications, allergies, ROS, gynecologic history (women only), surgical history, family history, social history, weight history, weight loss surgery history (for those that have had weight loss surgery), nutritional evaluation, mood and food questionnaire, PHQ9, Epworth questionnaire, sleep habits questionnaire, patient life and health improvement goals questionnaire. These will all be scanned into the patient's chart under media.   During the visit, I independently reviewed the patient's EKG, previous labs, bioimpedance scale results, and indirect calorimetry results. I used this information to medically tailor a meal plan for the patient that will help her to lose weight and will improve her obesity-related conditions. I performed a medically necessary appropriate examination and/or evaluation. I discussed the assessment and treatment plan with the patient. The patient was provided an opportunity to ask questions and all were answered. The patient agreed with the plan and demonstrated an understanding of the instructions. Labs were ordered at this visit and will be reviewed at the next visit unless critical results need  to be addressed immediately. Clinical information was updated and documented in the EMR.   In addition, they received basic education on identification of processed foods and reduction of these, different sources of lean proteins and  complex carbohydrates and how to eat balanced by incorporation of whole foods.  Reviewed by clinician on day of visit: allergies, medications, problem list, medical history, surgical history, family history, social history, and previous encounter notes.  I personally spent a total of 30 minutes in the care of the patient today including preparing to see the patient, getting/reviewing separately obtained history, performing a medically appropriate exam/evaluation, counseling and educating, placing orders, documenting clinical information in the EHR, and independently interpreting results. Does not include time for EKG, indirect calorimetry and depression screen   Lonell Liverpool ANP-C

## 2024-10-11 ENCOUNTER — Ambulatory Visit (INDEPENDENT_AMBULATORY_CARE_PROVIDER_SITE_OTHER): Payer: Self-pay | Admitting: Nurse Practitioner

## 2024-10-11 LAB — COMPREHENSIVE METABOLIC PANEL WITH GFR
ALT: 17 IU/L (ref 0–32)
AST: 18 IU/L (ref 0–40)
Albumin: 4.5 g/dL (ref 3.9–4.9)
Alkaline Phosphatase: 68 IU/L (ref 49–135)
BUN/Creatinine Ratio: 12 (ref 12–28)
BUN: 12 mg/dL (ref 8–27)
Bilirubin Total: 0.4 mg/dL (ref 0.0–1.2)
CO2: 27 mmol/L (ref 20–29)
Calcium: 9.6 mg/dL (ref 8.7–10.3)
Chloride: 106 mmol/L (ref 96–106)
Creatinine, Ser: 0.97 mg/dL (ref 0.57–1.00)
Globulin, Total: 2 g/dL (ref 1.5–4.5)
Glucose: 88 mg/dL (ref 70–99)
Potassium: 4.6 mmol/L (ref 3.5–5.2)
Sodium: 147 mmol/L — ABNORMAL HIGH (ref 134–144)
Total Protein: 6.5 g/dL (ref 6.0–8.5)
eGFR: 66 mL/min/1.73 (ref >59)

## 2024-10-11 LAB — VITAMIN B12: Vitamin B-12: 373 pg/mL (ref 232–1245)

## 2024-10-11 LAB — VITAMIN D 25 HYDROXY (VIT D DEFICIENCY, FRACTURES): Vit D, 25-Hydroxy: 31.4 ng/mL (ref 30.0–100.0)

## 2024-10-11 LAB — MAGNESIUM: Magnesium: 1.9 mg/dL (ref 1.6–2.3)

## 2024-10-11 LAB — INSULIN, RANDOM: INSULIN: 12.8 u[IU]/mL (ref 2.6–24.9)

## 2024-10-11 LAB — HEMOGLOBIN A1C
Est. average glucose Bld gHb Est-mCnc: 123 mg/dL
Hgb A1c MFr Bld: 5.9 % — ABNORMAL HIGH (ref 4.8–5.6)

## 2024-10-13 ENCOUNTER — Ambulatory Visit: Admitting: Family

## 2024-10-17 ENCOUNTER — Encounter: Payer: Self-pay | Admitting: Family

## 2024-10-17 ENCOUNTER — Ambulatory Visit: Payer: Self-pay | Admitting: Family

## 2024-10-17 VITALS — BP 124/80 | HR 100 | Temp 97.5°F | Ht 63.0 in | Wt 213.2 lb

## 2024-10-17 DIAGNOSIS — M25551 Pain in right hip: Secondary | ICD-10-CM | POA: Diagnosis not present

## 2024-10-17 DIAGNOSIS — M25561 Pain in right knee: Secondary | ICD-10-CM | POA: Diagnosis not present

## 2024-10-17 DIAGNOSIS — E559 Vitamin D deficiency, unspecified: Secondary | ICD-10-CM

## 2024-10-17 DIAGNOSIS — D509 Iron deficiency anemia, unspecified: Secondary | ICD-10-CM

## 2024-10-17 DIAGNOSIS — G8929 Other chronic pain: Secondary | ICD-10-CM

## 2024-10-17 DIAGNOSIS — E785 Hyperlipidemia, unspecified: Secondary | ICD-10-CM | POA: Diagnosis not present

## 2024-10-17 DIAGNOSIS — F331 Major depressive disorder, recurrent, moderate: Secondary | ICD-10-CM

## 2024-10-17 MED ORDER — MELOXICAM 7.5 MG PO TABS: 7.5000 mg | ORAL_TABLET | Freq: Every day | ORAL | 0 refills | Status: AC

## 2024-10-17 MED ORDER — BUPROPION HCL ER (XL) 150 MG PO TB24: 150.0000 mg | ORAL_TABLET | Freq: Every day | ORAL | 1 refills | Status: AC

## 2024-10-17 NOTE — Patient Instructions (Signed)
-   Please get right knee and right hip  X-ray at [facility] imaging at [facility] then will call you with results.

## 2024-10-17 NOTE — Progress Notes (Signed)
 "  Provider: Velisa Regnier FNP-C   Yonael Tulloch, Roxan BROCKS, NP  Patient Care Team: Zaiah Credeur, Roxan BROCKS, NP as PCP - General (Family Medicine)  Extended Emergency Contact Information Primary Emergency Contact: Gottwald,Moriah Address: [address]          [place], [place] [address] [place] Home Phone: [phone] Relation: Daughter  Code Status:  Full Code  Goals of care: Advanced Directive information    09/13/2024    8:00 AM  Advanced Directives  Does Patient Have a Medical Advance Directive? No  Would patient like information on creating a medical advance directive? No - Patient declined     Chief Complaint  Patient presents with   medication management of chronic issues    4 month follow up with fasting labs.     HPI:  Pt is a 63 y.o. female seen today for 4 months follow up for medical management of chronic diseases.    Right knee pain - had to call out of walk last week due to inflammation on the knee.swollen pocket at the back of the knee. Tender to touch. Has not tried any OTC medication.states weight is a contributory factor but currently following up with weight management.   Right hip pain - ongoing for about 6 months.worst with trying to lift leg up.  Major depression - states stable on Wellbutrin .tenders to be more anxious with so many things going on in her head.Discussed writing on journal or calendar to help with panning and prevent being overwhelm. No SI  Past Medical History:  Diagnosis Date   Anemia    Anxiety    Back pain    Bilateral swelling of feet    Depression    High cholesterol    History of colonoscopy 2010      Joint pain    Knee pain    Vitamin D  deficiency    Past Surgical History:  Procedure Laterality Date   CESAREAN SECTION     [id number]   COLONOSCOPY      Allergies[1]  Allergies as of 10/17/2024   No Known Allergies      Medication List        Accurate as of October 17, 2024  9:57 AM. If you have any  questions, ask your nurse or doctor.          buPROPion  150 MG 24 hr tablet Commonly known as: WELLBUTRIN  XL Take 1 tablet (150 mg total) by mouth daily.   Vitamin D  (Cholecalciferol ) 25 MCG (1000 UT) Tabs Take 5,000 Units by mouth once a week.        Review of Systems  Constitutional:  Negative for appetite change, chills, fatigue, fever and unexpected weight change.  HENT:  Negative for congestion, dental problem, ear discharge, ear pain, facial swelling, hearing loss, nosebleeds, postnasal drip, rhinorrhea, sinus pressure, sinus pain, sneezing, sore throat, tinnitus and trouble swallowing.   Eyes:  Negative for pain, discharge, redness, itching and visual disturbance.  Respiratory:  Negative for cough, chest tightness, shortness of breath and wheezing.   Cardiovascular:  Negative for chest pain, palpitations and leg swelling.  Gastrointestinal:  Negative for abdominal distention, abdominal pain, blood in stool, constipation, diarrhea, nausea and vomiting.  Endocrine: Negative for cold intolerance, heat intolerance, polydipsia, polyphagia and polyuria.  Genitourinary:  Negative for difficulty urinating, dysuria, flank pain, frequency and urgency.  Musculoskeletal:  Positive for arthralgias. Negative for back pain, gait problem, joint swelling, myalgias, neck pain and neck stiffness.  Right knee and hip pain   Skin:  Negative for color change, pallor, rash and wound.  Neurological:  Negative for dizziness, syncope, speech difficulty, weakness, light-headedness, numbness and headaches.  Hematological:  Does not bruise/bleed easily.  Psychiatric/Behavioral:  Negative for agitation, behavioral problems, confusion, hallucinations, self-injury, sleep disturbance and suicidal ideas. The patient is not nervous/anxious.     Immunization History  Administered Date(s) Administered   Moderna Sars-Covid-2 Vaccination 01/08/2020, 02/05/2020   Td 10/26/1990, 11/11/2007, 03/29/2018    Pertinent  Health Maintenance Due  Topic Date Due   Influenza Vaccine  01/23/2025 (Originally 05/26/2024)   Mammogram  01/05/2026   Colonoscopy  03/25/2028      12/23/2023    9:37 AM 01/12/2024    3:36 PM 05/15/2024    2:56 PM 09/13/2024    8:00 AM 10/17/2024    9:34 AM  Fall Risk  Falls in the past year? 0 0 1 1 0  Was there an injury with Fall? 0  0  0  0  0  Fall Risk Category Calculator 0 0 2 2 0  Patient at Risk for Falls Due to No Fall Risks No Fall Risks Impaired balance/gait  History of fall(s)  Fall risk Follow up Falls evaluation completed Falls evaluation completed Falls evaluation completed Falls evaluation completed Falls evaluation completed     Data saved with a previous flowsheet row definition   Functional Status Survey:    Vitals:   10/17/24 0935 10/17/24 0939  BP: (!) 140/92 124/80  Pulse: 100   Temp: (!) 97.5 F (36.4 C)   SpO2: 97%   Weight: 213 lb 3.2 oz (96.7 kg)   Height: 5' 3 (1.6 m)    Body mass index is 37.77 kg/m. Physical Exam Vitals reviewed.  Constitutional:      General: She is not in acute distress.    Appearance: Normal appearance. She is normal weight. She is not ill-appearing or diaphoretic.  HENT:     Head: Normocephalic.     Right Ear: Tympanic membrane, ear canal and external ear normal. There is no impacted cerumen.     Left Ear: Tympanic membrane, ear canal and external ear normal. There is no impacted cerumen.     Nose: Nose normal. No congestion or rhinorrhea.     Mouth/Throat:     Mouth: Mucous membranes are moist.     Pharynx: Oropharynx is clear. No oropharyngeal exudate or posterior oropharyngeal erythema.  Eyes:     General: No scleral icterus.       Right eye: No discharge.        Left eye: No discharge.     Extraocular Movements: Extraocular movements intact.     Conjunctiva/sclera: Conjunctivae normal.     Pupils: Pupils are equal, round, and reactive to light.  Neck:     Vascular: No carotid bruit.   Cardiovascular:     Rate and Rhythm: Normal rate and regular rhythm.     Pulses: Normal pulses.     Heart sounds: Normal heart sounds. No murmur heard.    No friction rub. No gallop.  Pulmonary:     Effort: Pulmonary effort is normal. No respiratory distress.     Breath sounds: Normal breath sounds. No wheezing, rhonchi or rales.  Chest:     Chest wall: No tenderness.  Abdominal:     General: Bowel sounds are normal. There is no distension.     Palpations: Abdomen is soft. There is no mass.  Tenderness: There is no abdominal tenderness. There is no right CVA tenderness, left CVA tenderness, guarding or rebound.  Musculoskeletal:        General: No swelling.     Cervical back: Normal range of motion. No rigidity or tenderness.     Right knee: No swelling, effusion, erythema, ecchymosis or crepitus. Decreased range of motion. No tenderness.     Left knee: Normal.     Right lower leg: No edema.     Left lower leg: No edema.     Comments: Tender behind the knee   Lymphadenopathy:     Cervical: No cervical adenopathy.  Skin:    General: Skin is warm and dry.     Coloration: Skin is not pale.     Findings: No bruising, erythema, lesion or rash.  Neurological:     Mental Status: She is alert and oriented to person, place, and time.     Cranial Nerves: No cranial nerve deficit.     Sensory: No sensory deficit.     Motor: No weakness.     Coordination: Coordination normal.     Gait: Gait normal.  Psychiatric:        Mood and Affect: Mood normal.        Speech: Speech normal.        Behavior: Behavior normal.        Thought Content: Thought content normal.        Judgment: Judgment normal.      Labs reviewed: Recent Labs    12/23/23 1017 10/10/24 0933  NA 143 147*  K 4.4 4.6  CL 105 106  CO2 30 27  GLUCOSE 88 88  BUN 11 12  CREATININE 0.87 0.97  CALCIUM  9.7 9.6  MG  --  1.9   Recent Labs    12/23/23 1017 10/10/24 0933  AST 17 18  ALT 16 17  ALKPHOS  --  68   BILITOT 0.5 0.4  PROT 6.7 6.5  ALBUMIN  --  4.5   Recent Labs    12/23/23 1017  WBC 3.4*  NEUTROABS 1,761  HGB 11.3*  HCT 35.4  MCV 88.9  PLT 216   Lab Results  Component Value Date   TSH 1.25 12/23/2023   Lab Results  Component Value Date   HGBA1C 5.9 (H) 10/10/2024   Lab Results  Component Value Date   CHOL 220 (H) 12/23/2023   HDL 96 12/23/2023   LDLCALC 109 (H) 12/23/2023   TRIG 68 12/23/2023   CHOLHDL 2.3 12/23/2023    Significant Diagnostic Results in last 30 days:  No results found.  Assessment/Plan 1. Major depressive disorder, recurrent episode, moderate (HCC) Worsening symptoms but no suicidal ideation -Start on Wellbutrin  side effects discussed - buPROPion  (WELLBUTRIN  XL) 150 MG 24 hr tablet; Take 1 tablet (150 mg total) by mouth daily.  Dispense: 90 tablet; Refill: 1 - Follow-up in 1 month to evaluate - TSH  2. Chronic pain of right knee (Primary) Chronic back pain as well - Start on Mobic  as below.  Side effects discussed advised to take with food - meloxicam  (MOBIC ) 7.5 MG tablet; Take 1 tablet (7.5 mg total) by mouth daily.  Dispense: 30 tablet; Refill: 0 - Ambulatory referral to Orthopedic Surgery  3. Hyperlipidemia LDL goal <100 Previous LDL 109 -Dietary modification and exercise as tolerated advised - Lipid panel  4. Vitamin D  deficiency No recent level for evaluation - Continue on vitamin D  supplement  5. Iron deficiency anemia, unspecified  iron deficiency anemia type Latest hemoglobin 11.3 - Encouraged to increase foods that are high in the iron in her diet - CBC with Differential/Platelet  6. Chronic right hip pain Chronic but has worsened will obtain imaging due to decreased range of motion due to pain - DG Knee Complete 4 Views Right; Future - meloxicam  (MOBIC ) 7.5 MG tablet; Take 1 tablet (7.5 mg total) by mouth daily.  Dispense: 30 tablet; Refill: 0 - Ambulatory referral to Orthopedic Surgery  Family/ staff Communication:  Reviewed plan of care with patient verbalized understanding  Labs/tests ordered:  - DG Knee Complete 4 Views Right; Future  Next Appointment : Return in about 6 months (around 04/17/2025) for annual Physical examination.  Spent 30 minutes of Face to face and non-face to face with patient  >50% time spent counseling; reviewing medical record; tests; labs; documentation and developing future plan of care.   Roxan BROCKS Alyjah Lovingood, NP       [1] No Known Allergies  "

## 2024-10-18 ENCOUNTER — Ambulatory Visit: Payer: Self-pay | Admitting: Family

## 2024-10-18 LAB — CBC WITH DIFFERENTIAL/PLATELET
Absolute Lymphocytes: 1246 {cells}/uL (ref 850–3900)
Absolute Monocytes: 217 {cells}/uL (ref 200–950)
Basophils Absolute: 39 {cells}/uL (ref 0–200)
Basophils Relative: 1.1 %
Eosinophils Absolute: 60 {cells}/uL (ref 15–500)
Eosinophils Relative: 1.7 %
HCT: 36.2 % (ref 35.9–46.0)
Hemoglobin: 11.7 g/dL (ref 11.7–15.5)
MCH: 28.7 pg (ref 27.0–33.0)
MCHC: 32.3 g/dL (ref 31.6–35.4)
MCV: 88.7 fL (ref 81.4–101.7)
MPV: 11.6 fL (ref 7.5–12.5)
Monocytes Relative: 6.2 %
Neutro Abs: 1939 {cells}/uL (ref 1500–7800)
Neutrophils Relative %: 55.4 %
Platelets: 210 Thousand/uL (ref 140–400)
RBC: 4.08 Million/uL (ref 3.80–5.10)
RDW: 14.3 % (ref 11.0–15.0)
Total Lymphocyte: 35.6 %
WBC: 3.5 Thousand/uL — ABNORMAL LOW (ref 3.8–10.8)

## 2024-10-18 LAB — LIPID PANEL
Cholesterol: 202 mg/dL — ABNORMAL HIGH
HDL: 80 mg/dL
LDL Cholesterol (Calc): 104 mg/dL — ABNORMAL HIGH
Non-HDL Cholesterol (Calc): 122 mg/dL
Total CHOL/HDL Ratio: 2.5 (calc)
Triglycerides: 86 mg/dL

## 2024-10-18 LAB — TSH: TSH: 1.27 m[IU]/L (ref 0.40–4.50)

## 2024-10-24 ENCOUNTER — Ambulatory Visit (INDEPENDENT_AMBULATORY_CARE_PROVIDER_SITE_OTHER): Admitting: Nurse Practitioner

## 2024-10-24 ENCOUNTER — Encounter (INDEPENDENT_AMBULATORY_CARE_PROVIDER_SITE_OTHER): Payer: Self-pay | Admitting: Nurse Practitioner

## 2024-10-24 VITALS — BP 146/80 | HR 73 | Temp 97.9°F | Ht 63.0 in | Wt 213.0 lb

## 2024-10-24 DIAGNOSIS — E7800 Pure hypercholesterolemia, unspecified: Secondary | ICD-10-CM

## 2024-10-24 DIAGNOSIS — Z6837 Body mass index (BMI) 37.0-37.9, adult: Secondary | ICD-10-CM | POA: Diagnosis not present

## 2024-10-24 DIAGNOSIS — E66812 Obesity, class 2: Secondary | ICD-10-CM | POA: Diagnosis not present

## 2024-10-24 DIAGNOSIS — E88819 Insulin resistance, unspecified: Secondary | ICD-10-CM

## 2024-10-24 DIAGNOSIS — E559 Vitamin D deficiency, unspecified: Secondary | ICD-10-CM

## 2024-10-24 DIAGNOSIS — R7303 Prediabetes: Secondary | ICD-10-CM

## 2024-10-24 MED ORDER — VITAMIN D (ERGOCALCIFEROL) 1.25 MG (50000 UNIT) PO CAPS: 50000.0000 [IU] | ORAL_CAPSULE | ORAL | 0 refills | Status: AC

## 2024-10-24 NOTE — Patient Instructions (Signed)
 Metformin Extended-Release Suspension What is this medication? METFORMIN (met FOR min) treats type 2 diabetes. It controls blood sugar (glucose) and helps your body use insulin  effectively. Changes to diet and exercise are often combined with this medication. This medicine may be used for other purposes; ask your health care provider or pharmacist if you have questions. COMMON BRAND NAME(S): RIOMET ER What should I tell my care team before I take this medication? They need to know if you have any of these conditions: Dehydration Frequently drink alcohol Have had diabetic ketoacidosis (DKA) Having a CT or X-ray scan Having surgery Heart attack Heart disease Infection Irregular menstrual cycles Kidney disease Liver disease Low levels of calcium  in your blood Low levels of vitamin B in your blood Stroke An unusual or allergic reaction to metformin, other medications, foods, dyes, or preservatives Pregnant or trying to get pregnant Breastfeeding How should I use this medication? Take this medication by mouth. Take it as directed on the prescription label at the same time every day. Take it with food at the start of a meal. Shake well before using. Use a specially marked oral syringe, spoon, or dropper to measure each dose. Ask your pharmacist if you do not have one. Household spoons are not accurate. Keep taking it unless your care team tells you to stop. Talk to your care team about the use of this medication in children. While it may be prescribed for children as young as 10 years for selected conditions, precautions do apply. Overdosage: If you think you have taken too much of this medicine contact a poison control center or emergency room at once. NOTE: This medicine is only for you. Do not share this medicine with others. What if I miss a dose? If you miss a dose, take it as soon as you can. If it is almost time for your next dose, take only that dose. Do not take double or extra  doses. What may interact with this medication? Do not take this medication with any of the following: Certain contrast agents used before, CT, MRI, or X-ray scans This medication may also interact with the following: Acetazolamide Alcohol Cimetidine Dichlorphenamide Dolutegravir Ranolazine Topiramate Vandetanib Zonisamide Some medications may affect your blood sugar levels or hide the symptoms of low blood sugar (hypoglycemia). Talk with your care team about all the medications you take. They may suggest checking your blood sugar levels more often. Medications that may affect your blood sugar levels include: Alcohol Certain antibiotics, such as ciprofloxacin, levofloxacin, sulfamethoxazole; trimethoprim Certain medications for blood pressure or heart disease, such as benazepril, enalapril, lisinopril, losartan, valsartan Certain medications for mental health conditions, such as fluoxetine or olanzapine Diuretics, such as hydrochlorothiazide (HCTZ) Estrogen and progestin hormones Other medications for diabetes Steroid medications, such as prednisone  or cortisone Testosterone Thyroid  hormones Medications that may mask symptoms of low blood sugar include: Beta-blockers, such as atenolol, metoprolol, propranolol Clonidine Guanethidine Reserpine This list may not describe all possible interactions. Give your health care provider a list of all the medicines, herbs, non-prescription drugs, or dietary supplements you use. Also tell them if you smoke, drink alcohol, or use illegal drugs. Some items may interact with your medicine. What should I watch for while using this medication? Visit your care team for regular checks on your progress. Tell your care team if your symptoms do not start to get better or if they get worse. You may need blood work done while you are taking this medication. Your care team will monitor  your HbA1C (A1C). This test shows what your average blood sugar (glucose)  level was over the past 2 to 3 months. Know the symptoms of low blood sugar and know how to treat it. Always carry a source of quick sugar with you. Examples include hard sugar candy or glucose tablets. Make sure others know that you can choke if you eat or drink if your blood sugar is too low and you are unable to care for yourself. Get medical help at once. Tell your care team if you have high blood sugar. Your medication dose may change if your body is under stress. Some types of stress that may affect your blood sugar include fever, infection, and surgery. Wear a medical ID bracelet or chain. Carry a card that describes your condition. List the medications and doses you take on the card. Make sure you stay hydrated while taking this medication. Drink water often. Eat fruits and veggies that have a high water content. Drink more water when it is hot or you are active. Talk to your care team right away if you have fever, infection, vomiting, diarrhea, or if you sweat a lot while taking this medication. The loss of too much body fluid may make it dangerous for you to take this medication. Tell your care team you are taking this medication before you have surgery or an imaging scan, such as a CT or X-ray. You may need to stop taking this medication for a while before and after the procedure. Your care team will tell you when to stop and when to start taking it again. Make sure you get enough vitamin B12 while you are taking this medication. Discuss the foods you eat and the vitamins you take with your care team. This medication may cause you to ovulate, which may increase your chances of becoming pregnant. Talk with your care team about contraception while you are taking this medication. Contact your care team if you think you might be pregnant. What side effects may I notice from receiving this medication? Side effects that you should report to your care team as soon as possible: Allergic reactions--skin  rash, itching, hives, swelling of the face, lips, tongue, or throat High lactic acid level--muscle pain or cramps, stomach pain, trouble breathing, general discomfort and fatigue Low vitamin B12 level--pain, tingling, or numbness in the hands or feet, muscle weakness, dizziness, confusion, trouble concentrating Side effects that usually do not require medical attention (report these to your care team if they continue or are bothersome): Diarrhea Gas Headache Metallic taste in mouth Nausea This list may not describe all possible side effects. Call your doctor for medical advice about side effects. You may report side effects to FDA at 1-800-FDA-1088. Where should I keep my medication? Keep out of the reach of children and pets. Store at room temperature between 20 and 25 degrees C (68 and 77 degrees F). Get rid of any unused medication after the expiration date. To get rid of medications that are no longer needed or have expired: Take the medication to a medication take-back program. Check with your pharmacy or law enforcement to find a location. If you cannot return the medication, check the label or package insert to see if the medication should be thrown out in the garbage or flushed down the toilet. If you are not sure, ask your care team. If it is safe to put in the trash, empty the medication out of the container. Mix the medication with cat litter, dirt,  coffee grounds, or other unwanted substance. Seal the mixture in a bag or container. Put it in the trash. NOTE: This sheet is a summary. It may not cover all possible information. If you have questions about this medicine, talk to your doctor, pharmacist, or health care provider.  2024 Elsevier/Gold Standard (2023-09-24 00:00:00)  Insulin  Resistance  Insulin  is a hormone that is made by the pancreas. Insulin  allows blood sugar (glucose) to enter the cells in the body. Insulin  helps the body use glucose for energy. Normally, the body is  insulin  sensitive, which means the cells in the body are effective at absorbing glucose. Insulin  resistance is when the cells in the body do not respond properly to insulin  and are not able to absorb glucose. The pancreas makes more insulin , but over time the body cannot make enough insulin  to keep glucose at normal levels. Insulin  resistance results in high blood glucose levels (hyperglycemia) and can lead to problems, including: Prediabetes. Type 2 diabetes (diabetes mellitus). Heart disease. High blood pressure (hypertension). Stroke. Polycystic ovary syndrome (PCOS). Nonalcoholic fatty liver disease. What are the causes? The exact cause of insulin  resistance is not known. What increases the risk? The following factors may make you more likely to develop insulin  resistance: Being overweight or obese, especially if a lot of your weight is in your waist area. Having an inactive (sedentary) lifestyle. Having above-normal glucose levels. Having abnormal cholesterol levels. Having sleep apnea. Being older than age 44. Using steroids. What are the signs or symptoms? This condition usually does not cause symptoms. A waist measurement of more than 35 inches (88.9 cm) for women and more than 40 inches (101.6 cm) for men may be a sign of insulin  resistance. How is this diagnosed? There is no test to diagnose insulin  resistance. However, your health care provider may diagnose insulin  resistance based on: A physical exam. Your medical history. Blood tests that check your blood glucose level. How is this treated? Insulin  resistance is treated with nutrition and lifestyle changes. These changes may include: Eating a healthy balance of nutritious foods. Getting more physical activity. Maintaining a healthy weight. Stopping the use of any tobacco products. Your health care provider will work with you to change your nutrition and lifestyle as needed. In some cases, treatment may also include  medicine to improve your insulin  sensitivity. Follow these instructions at home: Activity Be physically active. Do moderate-intensity physical activity for at least 30 minutes on 5 or more days of the week, or as told by your health care provider. This could include brisk walking, biking, or water aerobics. Ask your health care provider what activities are safe for you. A mix of physical activities may be best, such as walking, swimming, biking, and strength training. Eating and drinking  Follow a healthy meal plan. This includes eating: Lean proteins. Complex carbohydrates. Examples of these include whole grains, starchy vegetables (potatoes, corn, peas), and beans. Fresh fruits and vegetables. Low-fat dairy products. Healthy fats. Follow instructions from your health care provider about eating or drinking restrictions. Make an appointment to see a diet and nutrition specialist (registered dietitian) to help you create a healthy eating plan. General instructions Check your blood glucose levels as told by your health care provider. Take over-the-counter and prescription medicines only as told by your health care provider. Lose weight as told by your health care provider. Losing 5-7% of your body weight can reverse insulin  resistance. Your health care provider can determine how much weight loss is best for you  and can help you lose weight safely. Do not use any products that contain nicotine or tobacco. These products include cigarettes, chewing tobacco, and vaping devices, such as e-cigarettes. If you need help quitting, ask your health care provider. Keep all follow-up visits. This is important. Contact a health care provider if: You have trouble losing weight or maintaining your goal weight. You gain weight. You have trouble following your prescribed meal plan. You have trouble exercising more. Summary Insulin  resistance occurs when cells in the body do not respond properly to insulin   and are not able to absorb blood sugar (glucose). The body makes more insulin , but over time the body cannot make enough insulin  to keep blood sugar at normal levels. Insulin  resistance is treated with nutrition and lifestyle changes, including eating a healthy balance of nutritious foods, getting more physical activity, and maintaining a healthy weight. Your health care provider will work with you to change your nutrition and lifestyle as needed. Treatment may also include medicine to improve your insulin  sensitivity. Check your blood glucose levels as told by your health care provider. Keep all follow-up visits. This is important. This information is not intended to replace advice given to you by your health care provider. Make sure you discuss any questions you have with your health care provider. Document Revised: 08/22/2023 Document Reviewed: 08/22/2023 Elsevier Patient Education  2025 [employer].

## 2024-10-24 NOTE — Progress Notes (Signed)
 " Office: [phone]  /  Fax: [phone]  WEIGHT SUMMARY AND BIOMETRICS  Weight Lost Since Last Visit: 2 lb  Weight Gained Since Last Visit: 0   Vitals Temp: 97.9 F (36.6 C) BP: (!) 146/80 Pulse Rate: 73 SpO2: 94 %   Anthropometric Measurements Height: 5' 3 (1.6 m) Weight: 213 lb (96.6 kg) BMI (Calculated): 37.74 Weight at Last Visit: 215 lb Weight Lost Since Last Visit: 2 lb Weight Gained Since Last Visit: 0 Starting Weight: 215 lb Total Weight Loss (lbs): 2 lb (0.907 kg) Peak Weight: 215 lb   Body Composition  Body Fat %: 45.2 % Fat Mass (lbs): 96.2 lbs Muscle Mass (lbs): 110.8 lbs Total Body Water (lbs): 77.4 lbs Visceral Fat Rating : 14   Other Clinical Data RMR: 1771 Fasting: no Labs: no Today's Visit #: 2 Starting Date: 10/10/24    Total Weight Loss: 2 pounds Bio Impedance Data reviewed with patient: Muscle is up 0.4 pounds, adipose is down 2.8 pounds  HPI  Chief Complaint: OBESITY  Susan [name] is here to discuss her progress with her obesity treatment plan. She is on the the Category 2 Plan and states she is following her eating plan approximately 50 % of the time. She states she is not currently exercising.   Interval History:  Since last office visit she has been having difficulty getting her protein daily.  She has skipped a lot of meals  She went away for a week for her birthday.  She gets maybe 40-50 grams of protein daily. She has been getting about 120 ounces of water daily. She has not done any consistent exercise.  Breakfast: can skip or eggs Lunch: She has been skipping or would eat soup, salad with tuna Dinner: Small portion of chicken  Lab results are reviewed with patient in detail. Fasting glucose, electrolytes, kidney functions, liver enzymes are all normal. Vit D does show deficiency and would benefit from supplementation. A1c is in prediabetic range at 5.9. Fasting insulin  is elevated at 12.8 with HOMA-IR score of 2.78  indicating insulin  resistance.   Cancer screenings are reviewed with patient as obesity is a risk cancer for certain cancers.   Pap: 01/08/23 negative Mammogram: 01/06/24 negative Colonoscopy: 03/26/23- next due 2029  ASCVD risk is reviewed with patient as obesity is a risk factor for cardiovascular disease  The 10-year ASCVD risk score (Arnett DK, et al., 2019) is: 7.4%- intermediate risk   Values used to calculate the score:     Age: 63 years     Clinically relevant sex: Female     Is Non-Hispanic African American: Yes     Diabetic: No     Tobacco smoker: No     Systolic Blood Pressure: 146 mmHg     Is BP treated: No     HDL Cholesterol: 80 mg/dL     Total Cholesterol: 202 mg/dL  PHYSICAL EXAM:  Blood pressure (!) 146/80, pulse 73, temperature 97.9 F (36.6 C), height 5' 3 (1.6 m), weight 213 lb (96.6 kg), SpO2 94%. Body mass index is 37.73 kg/m.  General: Well Developed, well nourished, and in no acute distress.  HEENT: Normocephalic, atraumatic; EOMI, sclerae are anicteric. Skin: Warm and dry, good turgor Chest:  Normal excursion, shape, no gross ABN Respiratory: No conversational dyspnea; speaking in full sentences NeuroM-Sk:  Normal gross ROM * 4 extremities  Psych: A and O X 3, insight adequate, mood- full    DIAGNOSTIC DATA REVIEWED:  BMET    Component Value Date/Time  NA 147 (H) 10/10/2024 0933   K 4.6 10/10/2024 0933   CL 106 10/10/2024 0933   CO2 27 10/10/2024 0933   GLUCOSE 88 10/10/2024 0933   GLUCOSE 88 12/23/2023 1017   BUN 12 10/10/2024 0933   CREATININE 0.97 10/10/2024 0933   CREATININE 0.87 12/23/2023 1017   CALCIUM  9.6 10/10/2024 0933   GFRNONAA 72 01/19/2008 1528   GFRAA 87 01/19/2008 1528   Lab Results  Component Value Date   HGBA1C 5.9 (H) 10/10/2024   Lab Results  Component Value Date   INSULIN  12.8 10/10/2024   Lab Results  Component Value Date   TSH 1.27 10/17/2024   CBC    Component Value Date/Time   WBC 3.5 (L) 10/17/2024  1027   RBC 4.08 10/17/2024 1027   HGB 11.7 10/17/2024 1027   HGB 10.4 (L) 02/14/2010 0842   HCT 36.2 10/17/2024 1027   HCT 30.8 (L) 02/14/2010 0842   PLT 210 10/17/2024 1027   PLT 184 02/14/2010 0842   MCV 88.7 10/17/2024 1027   MCV 84.3 02/14/2010 0842   MCH 28.7 10/17/2024 1027   MCHC 32.3 10/17/2024 1027   RDW 14.3 10/17/2024 1027   RDW 19.3 (H) 02/14/2010 0842   Iron Studies    Component Value Date/Time   IRON 43 02/14/2010 0842   TIBC 422 02/14/2010 0842   FERRITIN 9 (L) 02/14/2010 0842   IRONPCTSAT 10 (L) 02/14/2010 0842   Lipid Panel     Component Value Date/Time   CHOL 202 (H) 10/17/2024 1027   TRIG 86 10/17/2024 1027   HDL 80 10/17/2024 1027   CHOLHDL 2.5 10/17/2024 1027   VLDL 15.4 09/09/2021 0919   LDLCALC 104 (H) 10/17/2024 1027   Hepatic Function Panel     Component Value Date/Time   PROT 6.5 10/10/2024 0933   ALBUMIN 4.5 10/10/2024 0933   AST 18 10/10/2024 0933   ALT 17 10/10/2024 0933   ALKPHOS 68 10/10/2024 0933   BILITOT 0.4 10/10/2024 0933   BILIDIR 0.1 01/19/2008 1528      Component Value Date/Time   TSH 1.27 10/17/2024 1027   Nutritional Lab Results  Component Value Date   VD25OH 31.4 10/10/2024     ASSESSMENT AND PLAN  Class 2 severe obesity with serious comorbidity and body mass index (BMI) of 37.0 to 37.9 in adult, unspecified obesity type TREATMENT PLAN FOR OBESITY:  Recommended Dietary Goals  Susan [name] is currently in the action stage of change. As such, her goal is to continue weight management plan. She has agreed to the Category 2 Plan.  Behavioral Intervention  We discussed the following Behavioral Modification Strategies today: increasing lean protein intake to established goals, increasing vegetables, increasing fiber rich foods, avoiding skipping meals, increasing water intake , work on meal planning and preparation, continue to work on maintaining a reduced calorie state, getting the recommended amount of protein,  incorporating whole foods, making healthy choices, staying well hydrated and practicing mindfulness when eating., and increase protein intake, fibrous foods (25 grams per day for women, 30 grams for men) and water to improve satiety and decrease hunger signals. .  Additional resources provided today: Information given on insulin  resistance and Metformin  Recommended Physical Activity Goals  Susan [name] has been advised to work up to 150 minutes of moderate intensity aerobic activity a week and strengthening exercises 2-3 times per week for cardiovascular health, weight loss maintenance and preservation of muscle mass.   She has agreed to Think about enjoyable ways to increase  daily physical activity and overcoming barriers to exercise and Increase physical activity in their day and reduce sedentary time (increase NEAT).   Pharmacotherapy We discussed various medication options to help Susan [name] with her weight loss efforts and we both agreed to start ergocalciferol  50000 units once a week for Vit D deficiency.  ASSOCIATED CONDITIONS ADDRESSED TODAY  Action/Plan  Pure hypercholesterolemia Focus on implementing category 2 meal plan, limit saturated fats. Increase lean protein , water and fiber PCP recommended statin medication but she does not wish to take Loss of 10-15% body weight can improve lipid levels  Vitamin D  deficiency Supplement with Ergocalciferol  50000 units once a week Low vitamin D  levels can be associated with adiposity and may result in leptin resistance and weight gain. Also associated with fatigue.  -     Vitamin D  (Ergocalciferol ); Take 1 capsule (50,000 Units total) by mouth every 7 (seven) days.  Dispense: 5 capsule; Refill: 0  Prediabetes Insulin  Resistance Continue to implement Category 2  meal plan, limit simple carbohydrates. Increase lean protein, water and fiber Decreasing body weight by 10-15% can improve glucose levels Reviewed insulin  resistance and Metformin in  detail, also given written information and she will decide if she wishes to use Metformin, will work on nutrition and exercise for now     Return in about 4 weeks (around 11/21/2024).SABRA She was informed of the importance of frequent follow up visits to maximize her success with intensive lifestyle modifications for her multiple health conditions.   ATTESTASTION STATEMENTS:  Reviewed by clinician on day of visit: allergies, medications, problem list, medical history, surgical history, family history, social history, and previous encounter notes.    Lonell Liverpool ANP-C "

## 2024-11-01 ENCOUNTER — Ambulatory Visit (INDEPENDENT_AMBULATORY_CARE_PROVIDER_SITE_OTHER)

## 2024-11-01 ENCOUNTER — Ambulatory Visit

## 2024-11-01 ENCOUNTER — Telehealth: Payer: Self-pay

## 2024-11-01 VITALS — BP 124/73 | HR 94 | Ht 63.0 in | Wt 215.4 lb

## 2024-11-01 DIAGNOSIS — M25561 Pain in right knee: Secondary | ICD-10-CM | POA: Diagnosis not present

## 2024-11-01 DIAGNOSIS — M25551 Pain in right hip: Secondary | ICD-10-CM

## 2024-11-01 DIAGNOSIS — G8929 Other chronic pain: Secondary | ICD-10-CM

## 2024-11-01 DIAGNOSIS — M1611 Unilateral primary osteoarthritis, right hip: Secondary | ICD-10-CM | POA: Diagnosis not present

## 2024-11-01 DIAGNOSIS — M1711 Unilateral primary osteoarthritis, right knee: Secondary | ICD-10-CM | POA: Diagnosis not present

## 2024-11-01 NOTE — Telephone Encounter (Signed)
 Patient returned

## 2024-11-01 NOTE — Progress Notes (Deleted)
 Patient came in today to be seen for a office visit. Patient was roomed and had xrays done. Due to miscommunication, patient left without being seen. Will try to contact the patient to come back in to be seen by Dr Mariah.

## 2024-11-01 NOTE — Progress Notes (Addendum)
 "  Office Visit Note   Patient: Susan [name]           Date of Birth: 03/10/1961           MRN: [id number] Visit Date: 11/01/2024              Requested by: Leonarda Roxan BROCKS, NP [address] [place],  [place] [address] PCP: Leonarda Roxan BROCKS, NP   Assessment & Plan: Visit Diagnoses:  1. Arthritis of knee, right   2. Arthritis of right hip   3. Chronic pain of right knee   4. Pain of right hip     Plan: Natural history and expected course discussed. Questions answered. Recommend rest and ice. Recommend tylenol for pain PT referral for knee strengthening and ROM, as well as for her hip Hinged knee brace. Discussed intraarticular steroid injection of the right knee with patient. After discussing the risks(which include skin discoloration, infection, risk of speeding up arthritis, and risk of hyperglycemia in diabetics) and benefits, they opted to hold off on the injection. Follow up in 3 months.  Orders:  Orders Placed This Encounter  Procedures   DG Hip Unilat W OR W/O Pelvis 2-3 Views Right   DG Knee 3 Views Right     Subjective: Chief Complaint: Right knee pain, right hip pain  HPI Patient is a 64 y.o. year old female who presents with pain involving the  right knee and hip. Onset of the symptoms was 6 months ago. Inciting event: none known. Current symptoms include pain located in the groin and the inside of the knee. Pain is aggravated by any weight bearing in the knee and with trying to put on her shoes in the hip.  Patient has had no prior history of knee problems.. Treatment to date: none.  Objective: Vital Signs: BP (!) 151/81 (Cuff Size: Large)   Pulse 65   Ht 5' 3 (1.6 m)   Wt 215 lb 6.4 oz (97.7 kg)   BMI 38.16 kg/m   Physical Exam Gen: Alert, No Acute Distress right knee: Skin intact, no erythema or induration noted. medial joint line and posterior tenderness to palpation. Range of motion 0 to 120. No instability with varus or valgus stress. Right  hip: Skin intact. No erythema or induration. + groin pain with FADIR. + log roll  Imaging: Radiographs of the right knee and right hip personally reviewed by me; reveal severe osteoarthritis of the right knee and mild OA of the right hip   PMFS History: Patient Active Problem List   Diagnosis Date Noted   Fatigue due to excessive exertion 10/10/2024   Immunization declined 12/23/2023   Hyperlipidemia LDL goal <100 12/23/2023   Encounter to establish care with new doctor 08/22/2021   Depression screening 08/22/2021   Major depressive disorder, recurrent episode, moderate (HCC) 08/22/2021   SOB (shortness of breath) on exertion 01/02/2020   History of colonoscopy 2010   ECZEMA 03/30/2008   COCCYGEAL PAIN 08/02/2007   DYSPNEA ON EXERTION 08/02/2007   FIBROIDS, UTERUS 08/01/2007   ANEMIA-IRON DEFICIENCY 08/01/2007   DEPRESSION 08/01/2007   Past Medical History:  Diagnosis Date   Anemia    Anxiety    Back pain    Bilateral swelling of feet    Depression    High cholesterol    History of colonoscopy 2010   [place]   Joint pain    Knee pain    Vitamin D  deficiency     Family History  Problem Relation Age of Onset   Cancer Mother    Hypertension Mother    Colon polyps Mother    Hyperlipidemia Mother    Heart attack Mother    Heart disease Father    Heart attack Father    Stroke Father    Hypertension Father    Colon polyps Sister    Hypertension Sister    Hypertension Sister    Cancer Brother    Alcohol abuse Brother    HIV Brother    Colon cancer Neg Hx    Esophageal cancer Neg Hx    Rectal cancer Neg Hx    Stomach cancer Neg Hx     Past Surgical History:  Procedure Laterality Date   CESAREAN SECTION     [id number]   COLONOSCOPY     Social History   Occupational History   Occupation: [employer] D[employer]TER  Tobacco Use   Smoking status: Never   Smokeless tobacco: Never  Vaping Use   Vaping status: Never Used  Substance and Sexual Activity   Alcohol use: Never    Drug use: Never   Sexual activity: Not on file   Current Outpatient Medications  Medication Instructions   buPROPion  (WELLBUTRIN  XL) 150 mg, Oral, Daily   meloxicam  (MOBIC ) 7.5 mg, Oral, Daily   Vitamin D  (Cholecalciferol ) 5,000 Units, Oral, Weekly   Vitamin D  (Ergocalciferol ) (DRISDOL ) 50,000 Units, Oral, Every 7 days   Allergies as of 11/01/2024   (No Known Allergies)   "

## 2024-11-01 NOTE — Addendum Note (Signed)
 Addended by: MARIAH SQUIBB on: 11/01/2024 04:42 PM   Modules accepted: Level of Service

## 2024-11-01 NOTE — Telephone Encounter (Signed)
 Patient left prior to seeing Dr Mariah due to miscommunication. Lvm for pt to cb to get her back in to be seen by MD

## 2024-11-01 NOTE — Progress Notes (Deleted)
 "  Office Visit Note   Patient: Susan [name]           Date of Birth: 23-Feb-1961           MRN: [id number] Visit Date: 11/01/2024              Requested by: Leonarda Roxan BROCKS, NP [address] [place],  [place] [address] PCP: Leonarda Roxan BROCKS, NP   Assessment & Plan: Visit Diagnoses:  1. Chronic pain of right knee   2. Pain of right hip     Plan: {KneePainPlan:34308::Natural history and expected course discussed. Questions answered.}  Orders:  Orders Placed This Encounter  Procedures   DG Hip Unilat W OR W/O Pelvis 2-3 Views Right   DG Knee 3 Views Right     Subjective: Chief Complaint: Right knee and hip pain  HPI Patient is a 64 y.o. year old female who presents {Knee RFV:14258} involving the  {R/L:30031} knee. Onset of the symptoms was {onset:14048}. Inciting event: {inciting event:14349}. Current symptoms include {Symptoms:14260}. Pain is aggravated by {knee pain aggravating factors:14088}.  Patient {hashadknee:34361}. Treatment to date: {knee pain tx to date:14089}.  Objective: Vital Signs: BP (!) 151/81 (Cuff Size: Large)   Pulse 65   Ht 5' 3 (1.6 m)   Wt 215 lb 6.4 oz (97.7 kg)   BMI 38.16 kg/m   Physical Exam Gen: Alert, No Acute Distress {right/left/bilateral:23720} knee: Skin intact, no erythema or induration noted. {kneepainlocation:34324} tenderness to palpation. Range of motion *** to ***. No instability with varus or valgus stress.  Imaging: Radiographs of the right knee and right hip personally reviewed by me; reveal severe osteoarthritis of the  medial compartment of the right knee; hip radiographs reveal mild OA   PMFS History: Patient Active Problem List   Diagnosis Date Noted   Fatigue due to excessive exertion 10/10/2024   Immunization declined 12/23/2023   Hyperlipidemia LDL goal <100 12/23/2023   Encounter to establish care with new doctor 08/22/2021   Depression screening 08/22/2021   Major depressive disorder, recurrent episode,  moderate (HCC) 08/22/2021   SOB (shortness of breath) on exertion 01/02/2020   History of colonoscopy 2010   ECZEMA 03/30/2008   COCCYGEAL PAIN 08/02/2007   DYSPNEA ON EXERTION 08/02/2007   FIBROIDS, UTERUS 08/01/2007   ANEMIA-IRON DEFICIENCY 08/01/2007   DEPRESSION 08/01/2007   Past Medical History:  Diagnosis Date   Anemia    Anxiety    Back pain    Bilateral swelling of feet    Depression    High cholesterol    History of colonoscopy 2010   [place]   Joint pain    Knee pain    Vitamin D  deficiency     Family History  Problem Relation Age of Onset   Cancer Mother    Hypertension Mother    Colon polyps Mother    Hyperlipidemia Mother    Heart attack Mother    Heart disease Father    Heart attack Father    Stroke Father    Hypertension Father    Colon polyps Sister    Hypertension Sister    Hypertension Sister    Cancer Brother    Alcohol abuse Brother    HIV Brother    Colon cancer Neg Hx    Esophageal cancer Neg Hx    Rectal cancer Neg Hx    Stomach cancer Neg Hx     Past Surgical History:  Procedure Laterality Date   CESAREAN SECTION  8012,8007   COLONOSCOPY     Social History   Occupational History   Occupation: [employer] D[employer]  Tobacco Use   Smoking status: Never   Smokeless tobacco: Never  Vaping Use   Vaping status: Never Used  Substance and Sexual Activity   Alcohol use: Never   Drug use: Never   Sexual activity: Not on file   Current Outpatient Medications  Medication Instructions   buPROPion  (WELLBUTRIN  XL) 150 mg, Oral, Daily   meloxicam  (MOBIC ) 7.5 mg, Oral, Daily   Vitamin D  (Cholecalciferol ) 5,000 Units, Oral, Weekly   Vitamin D  (Ergocalciferol ) (DRISDOL ) 50,000 Units, Oral, Every 7 days   Allergies as of 11/01/2024   (No Known Allergies)    "

## 2024-11-01 NOTE — Addendum Note (Signed)
 Addended by: VANDERBILT LIONEL CROME on: 11/01/2024 04:27 PM   Modules accepted: Orders

## 2024-11-23 ENCOUNTER — Ambulatory Visit (INDEPENDENT_AMBULATORY_CARE_PROVIDER_SITE_OTHER): Admitting: Nurse Practitioner

## 2024-12-05 ENCOUNTER — Ambulatory Visit (INDEPENDENT_AMBULATORY_CARE_PROVIDER_SITE_OTHER): Admitting: Nurse Practitioner

## 2025-01-15 ENCOUNTER — Ambulatory Visit: Payer: Self-pay | Admitting: Family

## 2025-04-20 ENCOUNTER — Ambulatory Visit: Admitting: Family
